# Patient Record
Sex: Male | Born: 1980 | Race: Black or African American | Hispanic: No | Marital: Single | Smoking: Former smoker
Health system: Southern US, Community
[De-identification: ages and names within clinical notes are randomized; demographics above are authoritative.]

## PROBLEM LIST (undated history)

## (undated) DIAGNOSIS — G8929 Other chronic pain: Secondary | ICD-10-CM

## (undated) DIAGNOSIS — M549 Dorsalgia, unspecified: Secondary | ICD-10-CM

## (undated) DIAGNOSIS — F32A Depression, unspecified: Secondary | ICD-10-CM

## (undated) DIAGNOSIS — K649 Unspecified hemorrhoids: Secondary | ICD-10-CM

## (undated) DIAGNOSIS — F329 Major depressive disorder, single episode, unspecified: Secondary | ICD-10-CM

## (undated) DIAGNOSIS — W3400XA Accidental discharge from unspecified firearms or gun, initial encounter: Secondary | ICD-10-CM

## (undated) DIAGNOSIS — E1140 Type 2 diabetes mellitus with diabetic neuropathy, unspecified: Secondary | ICD-10-CM

## (undated) DIAGNOSIS — E1169 Type 2 diabetes mellitus with other specified complication: Secondary | ICD-10-CM

## (undated) DIAGNOSIS — S02609A Fracture of mandible, unspecified, initial encounter for closed fracture: Secondary | ICD-10-CM

## (undated) DIAGNOSIS — E119 Type 2 diabetes mellitus without complications: Secondary | ICD-10-CM

## (undated) DIAGNOSIS — I469 Cardiac arrest, cause unspecified: Secondary | ICD-10-CM

## (undated) HISTORY — DX: Type 2 diabetes mellitus with other specified complication: E11.69

## (undated) HISTORY — DX: Cardiac arrest, cause unspecified: I46.9

## (undated) HISTORY — PX: APPENDECTOMY: SHX54

## (undated) HISTORY — DX: Dorsalgia, unspecified: M54.9

## (undated) HISTORY — DX: Fracture of mandible, unspecified, initial encounter for closed fracture: S02.609A

## (undated) HISTORY — DX: Depression, unspecified: F32.A

## (undated) HISTORY — DX: Unspecified hemorrhoids: K64.9

## (undated) HISTORY — PX: ABDOMINAL SURGERY: SHX537

## (undated) HISTORY — DX: Major depressive disorder, single episode, unspecified: F32.9

## (undated) HISTORY — DX: Other chronic pain: G89.29

## (undated) HISTORY — DX: Type 2 diabetes mellitus with diabetic neuropathy, unspecified: E11.40

## (undated) HISTORY — PX: OTHER SURGICAL HISTORY: SHX169

## (undated) HISTORY — PX: SPLENECTOMY, TOTAL: SHX788

---
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
## 1998-06-02 ENCOUNTER — Inpatient Hospital Stay (HOSPITAL_COMMUNITY): Admission: EM | Admit: 1998-06-02 | Discharge: 1998-06-09 | Payer: Self-pay | Admitting: Emergency Medicine

## 1998-06-02 ENCOUNTER — Encounter: Payer: Self-pay | Admitting: Emergency Medicine

## 2003-06-22 ENCOUNTER — Emergency Department (HOSPITAL_COMMUNITY): Admission: EM | Admit: 2003-06-22 | Discharge: 2003-06-22 | Payer: Self-pay | Admitting: Family Medicine

## 2003-09-27 ENCOUNTER — Emergency Department (HOSPITAL_COMMUNITY): Admission: EM | Admit: 2003-09-27 | Discharge: 2003-09-27 | Payer: Self-pay | Admitting: Family Medicine

## 2003-09-28 ENCOUNTER — Emergency Department (HOSPITAL_COMMUNITY): Admission: EM | Admit: 2003-09-28 | Discharge: 2003-09-28 | Payer: Self-pay | Admitting: Family Medicine

## 2003-09-29 ENCOUNTER — Emergency Department (HOSPITAL_COMMUNITY): Admission: EM | Admit: 2003-09-29 | Discharge: 2003-09-29 | Payer: Self-pay | Admitting: Family Medicine

## 2003-10-04 ENCOUNTER — Emergency Department (HOSPITAL_COMMUNITY): Admission: EM | Admit: 2003-10-04 | Discharge: 2003-10-04 | Payer: Self-pay | Admitting: Emergency Medicine

## 2003-10-11 ENCOUNTER — Emergency Department (HOSPITAL_COMMUNITY): Admission: EM | Admit: 2003-10-11 | Discharge: 2003-10-11 | Payer: Self-pay | Admitting: Emergency Medicine

## 2004-02-21 ENCOUNTER — Observation Stay (HOSPITAL_COMMUNITY): Admission: EM | Admit: 2004-02-21 | Discharge: 2004-02-22 | Payer: Self-pay | Admitting: Emergency Medicine

## 2004-02-21 ENCOUNTER — Ambulatory Visit: Payer: Self-pay | Admitting: Family Medicine

## 2004-02-23 ENCOUNTER — Emergency Department (HOSPITAL_COMMUNITY): Admission: EM | Admit: 2004-02-23 | Discharge: 2004-02-23 | Payer: Self-pay | Admitting: Emergency Medicine

## 2004-03-18 ENCOUNTER — Emergency Department (HOSPITAL_COMMUNITY): Admission: EM | Admit: 2004-03-18 | Discharge: 2004-03-19 | Payer: Self-pay | Admitting: Emergency Medicine
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
## 2006-12-02 ENCOUNTER — Observation Stay (HOSPITAL_COMMUNITY): Admission: EM | Admit: 2006-12-02 | Discharge: 2006-12-03 | Payer: Self-pay | Admitting: Emergency Medicine
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
## 2010-12-28 LAB — COMPREHENSIVE METABOLIC PANEL
Albumin: 3.9
BUN: 17
CO2: 32
Chloride: 96
Creatinine, Ser: 1.15
GFR calc non Af Amer: >60
Glucose, Bld: 115 — ABNORMAL HIGH
Total Bilirubin: 3 — ABNORMAL HIGH

## 2010-12-28 LAB — BASIC METABOLIC PANEL
Calcium: 9
Creatinine, Ser: 1.23
GFR calc Af Amer: >60
GFR calc non Af Amer: >60

## 2010-12-28 LAB — GLUCOSE, RANDOM: Glucose, Bld: 462 — ABNORMAL HIGH

## 2010-12-28 LAB — HEMOGLOBIN A1C
Hgb A1c MFr Bld: 9.6 — ABNORMAL HIGH
Mean Plasma Glucose: 264

## 2010-12-29 LAB — I-STAT 8, (EC8 V) (CONVERTED LAB)
Acid-Base Excess: 6 — ABNORMAL HIGH
Chloride: 97
Glucose, Bld: 162 — ABNORMAL HIGH
TCO2: 35
pCO2, Ven: 53.3 — ABNORMAL HIGH
pH, Ven: 7.402 — ABNORMAL HIGH

## 2010-12-29 LAB — RAPID URINE DRUG SCREEN, HOSP PERFORMED
Amphetamines: NOT DETECTED
Barbiturates: NOT DETECTED
Cocaine: NOT DETECTED
Opiates: NOT DETECTED
Tetrahydrocannabinol: NOT DETECTED

## 2010-12-29 LAB — URINALYSIS, ROUTINE W REFLEX MICROSCOPIC
Bilirubin Urine: NEGATIVE
Ketones, ur: 15 — AB
Leukocytes, UA: NEGATIVE
Nitrite: NEGATIVE
Protein, ur: 30 — AB

## 2012-01-09 ENCOUNTER — Emergency Department (HOSPITAL_COMMUNITY)
Admission: EM | Admit: 2012-01-09 | Discharge: 2012-01-09 | Disposition: A | Discharge status: Home / Self Care | Attending: Emergency Medicine | Admitting: Emergency Medicine

## 2012-01-09 ENCOUNTER — Encounter (HOSPITAL_COMMUNITY): Payer: Self-pay | Admitting: Emergency Medicine

## 2012-01-09 DIAGNOSIS — Z87891 Personal history of nicotine dependence: Secondary | ICD-10-CM | POA: Insufficient documentation

## 2012-01-09 DIAGNOSIS — E162 Hypoglycemia, unspecified: Secondary | ICD-10-CM

## 2012-01-09 DIAGNOSIS — Z794 Long term (current) use of insulin: Secondary | ICD-10-CM | POA: Insufficient documentation

## 2012-01-09 DIAGNOSIS — E1169 Type 2 diabetes mellitus with other specified complication: Secondary | ICD-10-CM | POA: Insufficient documentation

## 2012-01-09 HISTORY — DX: Type 2 diabetes mellitus without complications: E11.9

## 2012-01-09 LAB — GLUCOSE, CAPILLARY
Glucose-Capillary: 93 mg/dL (ref 70–99)
Glucose-Capillary: 177 mg/dL — ABNORMAL HIGH (ref 70–99)

## 2012-01-09 NOTE — ED Notes (Signed)
Awaiting transportation.
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
## 2012-01-09 NOTE — ED Provider Notes (Signed)
History     CSN: [id number]  Arrival date & time 01/09/12  1258   First MD Initiated Contact with Patient 01/09/12 1309      Chief Complaint  Patient presents with  . Hypoglycemia    (Consider location/radiation/quality/duration/timing/severity/associated sxs/prior treatment) The history is provided by the patient.   the patient presents from incarceration facility.  He's been incarcerated for approximately 9 years.  He is a diabetic.  Takes 25 units of Lantus at night and 20 units of 7030 insulin in the morning.  He takes 4 units of regular insulin as a sliding scale for 200-300.  He reports that he has a normal breakfast this morning but did not have his typical after breakfast/morning snack.  He was found to be hypoglycemic at around 11:30 AM.  He had breakfast around 6 AM.  He was given D50 and insulin glucose with improvement in his blood sugars.  He presents the emergency department where he was given orange juice and a meal tray.  His blood sugar on arrival to the emergency department was 93.  The patient reports a lifting this change was that he did have his typical morning snack.  He's otherwise been eating normally.  He denies nausea and vomiting.  No chest pain or abdominal pain.  He denies fevers or chills.  At this time is without any symptoms.  Past Medical History  Diagnosis Date  . Diabetes mellitus without complication     Past Surgical History  Procedure Date  . Abdominal surgery   . Spleenectomy   . Appendectomy     History reviewed. No pertinent family history.  History  Substance Use Topics  . Smoking status: Former Games developer  . Smokeless tobacco: Not on file  . Alcohol Use: No      Review of Systems  All other systems reviewed and are negative.    Allergies  Aspirin  Home Medications   Current Outpatient Rx  Name Route Sig Dispense Refill  . INSULIN GLARGINE 100 UNIT/ML [place] SOLN Subcutaneous Inject 25 Units into the skin at bedtime.    .  INSULIN ISOPHANE & REGULAR (70-30) 100 UNIT/ML [place] SUSP Subcutaneous Inject 20 Units into the skin daily.    . INSULIN REGULAR HUMAN 100 UNIT/ML IJ SOLN Subcutaneous Inject 4-14 Units into the skin 2 (two) times daily. Uses according to a sliding scale is sugar is over 200.      BP 177/98  Pulse 63  Temp 97.5 F (36.4 C) (Oral)  Resp 17  SpO2 98%  Physical Exam  Nursing note and vitals reviewed. Constitutional: He is oriented to person, place, and time. He appears well-developed and well-nourished.  HENT:  Head: Normocephalic and atraumatic.  Eyes: EOM are normal.  Neck: Normal range of motion.  Cardiovascular: Normal rate, regular rhythm, normal heart sounds and intact distal pulses.   Pulmonary/Chest: Effort normal and breath sounds normal. No respiratory distress.  Abdominal: Soft. He exhibits no distension. There is no tenderness.  Musculoskeletal: Normal range of motion.  Neurological: He is alert and oriented to person, place, and time.  Skin: Skin is warm and dry.  Psychiatric: He has a normal mood and affect. Judgment normal.    ED Course  Procedures (including critical care time)   Labs Reviewed  GLUCOSE, CAPILLARY  BASIC METABOLIC PANEL  CBC   No results found.   1. Hypoglycemia       MDM  Hypoglycemia secondary to change in diet habits as he did not  have his midmorning snack.  I suspect this is why he had a low at 11:30.  I recommended that he stay with a continuous feeding regimen/plan.  He understands importance of this.  The patient we discharged back to the incarceration facility        Lyanne Co, MD 01/09/12 1345

## 2013-03-19 HISTORY — PX: ORIF MANDIBULAR FRACTURE: SHX2127

## 2013-07-27 DIAGNOSIS — E119 Type 2 diabetes mellitus without complications: Secondary | ICD-10-CM | POA: Insufficient documentation

## 2015-02-03 ENCOUNTER — Emergency Department (HOSPITAL_COMMUNITY)
Admission: EM | Admit: 2015-02-03 | Discharge: 2015-02-03 | Disposition: A | Discharge status: Home / Self Care | Attending: Emergency Medicine | Admitting: Emergency Medicine

## 2015-02-03 ENCOUNTER — Encounter (HOSPITAL_COMMUNITY): Payer: Self-pay

## 2015-02-03 DIAGNOSIS — L02211 Cutaneous abscess of abdominal wall: Secondary | ICD-10-CM | POA: Insufficient documentation

## 2015-02-03 DIAGNOSIS — E1165 Type 2 diabetes mellitus with hyperglycemia: Secondary | ICD-10-CM | POA: Insufficient documentation

## 2015-02-03 DIAGNOSIS — Z760 Encounter for issue of repeat prescription: Secondary | ICD-10-CM

## 2015-02-03 DIAGNOSIS — Z87891 Personal history of nicotine dependence: Secondary | ICD-10-CM | POA: Insufficient documentation

## 2015-02-03 DIAGNOSIS — L0291 Cutaneous abscess, unspecified: Secondary | ICD-10-CM

## 2015-02-03 DIAGNOSIS — Z794 Long term (current) use of insulin: Secondary | ICD-10-CM | POA: Insufficient documentation

## 2015-02-03 DIAGNOSIS — Z9889 Other specified postprocedural states: Secondary | ICD-10-CM | POA: Insufficient documentation

## 2015-02-03 DIAGNOSIS — E119 Type 2 diabetes mellitus without complications: Secondary | ICD-10-CM | POA: Insufficient documentation

## 2015-02-03 DIAGNOSIS — Z79899 Other long term (current) drug therapy: Secondary | ICD-10-CM | POA: Insufficient documentation

## 2015-02-03 DIAGNOSIS — R739 Hyperglycemia, unspecified: Secondary | ICD-10-CM

## 2015-02-03 LAB — CBG MONITORING, ED
Glucose-Capillary: 210 mg/dL — ABNORMAL HIGH (ref 65–99)
Glucose-Capillary: 183 mg/dL — ABNORMAL HIGH (ref 65–99)

## 2015-02-03 MED ORDER — INSULIN GLARGINE 100 UNIT/ML ~~LOC~~ SOLN: SUBCUTANEOUS | Status: DC

## 2015-02-03 MED ORDER — INSULIN REGULAR HUMAN 100 UNIT/ML IJ SOLN: INTRAMUSCULAR | Status: DC

## 2015-02-03 MED ORDER — INSULIN PEN NEEDLE 31G X 8 MM MISC
Status: DC
Start: 2015-02-03 — End: 2015-04-25

## 2015-02-03 MED ORDER — LISINOPRIL 5 MG PO TABS: 20.0000 mg | ORAL_TABLET | Freq: Every day | ORAL | Status: DC

## 2015-02-03 MED ORDER — INSULIN GLARGINE 100 UNIT/ML ~~LOC~~ SOLN
25.0000 [IU] | Freq: Once | SUBCUTANEOUS | Status: AC
Start: 2015-02-03 — End: 2015-02-03
  Administered 2015-02-03: 25 [IU] via SUBCUTANEOUS
  Filled 2015-02-03: qty 0.25

## 2015-02-03 MED ORDER — MUPIROCIN 2 % EX OINT: TOPICAL_OINTMENT | CUTANEOUS | Status: DC

## 2015-02-03 MED ORDER — METFORMIN HCL 500 MG PO TABS: 500.0000 mg | ORAL_TABLET | Freq: Two times a day (BID) | ORAL | Status: DC

## 2015-02-03 NOTE — ED Notes (Signed)
Pharmacy was close to fill patients insulin, patient came back to get a shot of insulin to last until he can get his prescription filled tomorrow.

## 2015-02-03 NOTE — ED Notes (Signed)
cbg 183 in triage

## 2015-02-03 NOTE — ED Notes (Signed)
Patient states he just got out of prison today, and "needs insulin and needles" patient also states he has an abscess to the right hip.

## 2015-02-03 NOTE — Discharge Instructions (Signed)
Abscess An abscess (boil or furuncle) is an infected area on or under the skin. This area is filled with yellowish-white fluid (pus) and other material (debris). HOME CARE   Only take medicines as told by your doctor.  If you were given antibiotic medicine, take it as directed. Finish the medicine even if you start to feel better.  If gauze is used, follow your doctor's directions for changing the gauze.  To avoid spreading the infection:  Keep your abscess covered with a bandage.  Wash your hands well.  Do not share personal care items, towels, or whirlpools with others.  Avoid skin contact with others.  Keep your skin and clothes clean around the abscess.  Keep all doctor visits as told. GET HELP RIGHT AWAY IF:   You have more pain, puffiness (swelling), or redness in the wound site.  You have more fluid or blood coming from the wound site.  You have muscle aches, chills, or you feel sick.  You have a fever. MAKE SURE YOU:   Understand these instructions.  Will watch your condition.  Will get help right away if you are not doing well or get worse.   This information is not intended to replace advice given to you by your health care provider. Make sure you discuss any questions you have with your health care provider.   Document Released: 08/22/2007 Document Revised: 09/04/2011 Document Reviewed: 05/19/2011 Elsevier Interactive Patient Education [employer]  Medicine Refill at the Emergency Department We have refilled your medicine today, but it is best for you to get refills through your primary health care provider's office. In the future, please plan ahead so you do not need to get refills from the emergency department. If the medicine we refilled was a maintenance medicine, you may have received only enough to get you by until you are able to see your regular health care provider.   This information is not intended to replace advice given to you by your  health care provider. Make sure you discuss any questions you have with your health care provider.   Document Released: 06/22/2003 Document Revised: 03/26/2014 Document Reviewed: 06/12/2013 Elsevier Interactive Patient Education [employer] Elsevier Inc[employer]
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
## 2015-02-07 NOTE — ED Provider Notes (Signed)
CSN: [id number]     Arrival date & time 02/03/15  2118 History   First MD Initiated Contact with Patient 02/03/15 2144     Chief Complaint  Patient presents with  . Follow-up     (Consider location/radiation/quality/duration/timing/severity/associated sxs/prior Treatment) HPI   Jeremy Nguyen is a 34 y.o. male who presents to the Emergency Department shortly after being discharged from here after having his medications refilled.  He states that the pharmacy has already closed and he was unable to get his Lantus.  He states that he takes 25 units every evening.  He denies any symptoms at present.     Past Medical History  Diagnosis Date  . Diabetes mellitus without complication [facility](HCC)    Past Surgical History  Procedure Laterality Date  . Abdominal surgery    . Spleenectomy    . Appendectomy    . Splenectomy, total     History reviewed. No pertinent family history. Social History  Substance Use Topics  . Smoking status: Former Games developermoker  . Smokeless tobacco: None  . Alcohol Use: No    Review of Systems  Constitutional: Negative for fever, chills and appetite change.  HENT: Negative for congestion, sore throat and trouble swallowing.   Respiratory: Negative for shortness of breath.   Cardiovascular: Negative for chest pain.  Gastrointestinal: Negative for nausea, vomiting and diarrhea.  Endocrine: Negative for polydipsia and polyuria.  Neurological: Negative for dizziness, syncope and light-headedness.  All other systems reviewed and are negative.     Allergies  Aspirin and Ativan  Home Medications   Prior to Admission medications   Medication Sig Start Date End Date Taking? Authorizing Provider  insulin glargine (LANTUS) 100 UNIT/ML injection Inject 25 units into the skin at bedtime 02/03/15  Yes Tyrees Chopin, PA-C  insulin NPH-insulin regular (NOVOLIN 70/30) (70-30) 100 UNIT/ML injection Inject 20 Units into the skin daily.   Yes Historical Provider, MD   insulin regular (NOVOLIN R,HUMULIN R) 100 units/mL injection Inject 4-14 units into the skin 2 times a day.  Use according to a sliding scale if sugar is over 200 02/03/15  Yes Cresencio Reesor, PA-C  lisinopril (PRINIVIL,ZESTRIL) 5 MG tablet Take 4 tablets (20 mg total) by mouth daily. Patient taking differently: Take 5 mg by mouth daily.  02/03/15  Yes Verna Desrocher, PA-C  metFORMIN (GLUCOPHAGE) 500 MG tablet Take 1 tablet (500 mg total) by mouth 2 (two) times daily with a meal. 02/03/15  Yes Evalin Shawhan, PA-C  mupirocin ointment (BACTROBAN) 2 % Apply TID to the affected area x 10 days Patient taking differently: Apply 1 application topically 3 (three) times daily. Apply TID to the affected area x 10 days 02/03/15  Yes Vanisha Whiten, PA-C  Insulin Pen Needle 31G X 8 MM MISC Use as directed 02/03/15   Kynadi Dragos, PA-C   BP 137/73 mmHg  Pulse 89  Temp(Src) 98.3 F (36.8 C) (Oral)  Resp 14  SpO2 99% Physical Exam  Constitutional: He is oriented to person, place, and time. He appears well-developed and well-nourished. No distress.  HENT:  Head: Normocephalic and atraumatic.  Mouth/Throat: Oropharynx is clear and moist.  Neck: Normal range of motion. Neck supple.  Cardiovascular: Normal rate and regular rhythm.   Pulmonary/Chest: Effort normal and breath sounds normal. No respiratory distress.  Musculoskeletal: Normal range of motion.  Lymphadenopathy:    He has no cervical adenopathy.  Neurological: He is alert and oriented to person, place, and time. He exhibits normal muscle tone. Coordination  normal.  Skin: Skin is warm.  Psychiatric: He has a normal mood and affect.  Nursing note and vitals reviewed.   ED Course  Procedures (including critical care time) Labs Review Labs Reviewed  CBG MONITORING, ED - Abnormal; Notable for the following:    Glucose-Capillary 183 (*)    All other components within normal limits      MDM   Final diagnoses:  Hyperglycemia    Pt  just d/c from here and returned requesting his evening dose of Lantus because he was unable to get to the pharmacy before it closed.  Pt is well appearing, no sx's at present.      Pauline Aus, PA-C 02/07/15 2036  Bethann Berkshire, MD 02/09/15 [phone]

## 2015-04-25 ENCOUNTER — Encounter (HOSPITAL_COMMUNITY): Payer: Self-pay

## 2015-04-25 ENCOUNTER — Emergency Department (HOSPITAL_COMMUNITY)

## 2015-04-25 ENCOUNTER — Emergency Department (HOSPITAL_COMMUNITY)
Admission: EM | Admit: 2015-04-25 | Discharge: 2015-04-25 | Disposition: A | Discharge status: Home / Self Care | Attending: Emergency Medicine | Admitting: Emergency Medicine

## 2015-04-25 DIAGNOSIS — Z7984 Long term (current) use of oral hypoglycemic drugs: Secondary | ICD-10-CM | POA: Insufficient documentation

## 2015-04-25 DIAGNOSIS — Z794 Long term (current) use of insulin: Secondary | ICD-10-CM | POA: Insufficient documentation

## 2015-04-25 DIAGNOSIS — J069 Acute upper respiratory infection, unspecified: Secondary | ICD-10-CM

## 2015-04-25 DIAGNOSIS — Z79899 Other long term (current) drug therapy: Secondary | ICD-10-CM | POA: Insufficient documentation

## 2015-04-25 DIAGNOSIS — F17210 Nicotine dependence, cigarettes, uncomplicated: Secondary | ICD-10-CM | POA: Insufficient documentation

## 2015-04-25 DIAGNOSIS — E119 Type 2 diabetes mellitus without complications: Secondary | ICD-10-CM | POA: Insufficient documentation

## 2015-04-25 DIAGNOSIS — Z87828 Personal history of other (healed) physical injury and trauma: Secondary | ICD-10-CM | POA: Insufficient documentation

## 2015-04-25 HISTORY — DX: Accidental discharge from unspecified firearms or gun, initial encounter: W34.00XA

## 2015-04-25 MED ORDER — LORATADINE 10 MG PO TABS: 10.0000 mg | ORAL_TABLET | Freq: Every day | ORAL | Status: DC

## 2015-04-25 MED ORDER — ALBUTEROL SULFATE (2.5 MG/3ML) 0.083% IN NEBU: 5.0000 mg | INHALATION_SOLUTION | Freq: Once | RESPIRATORY_TRACT | Status: DC

## 2015-04-25 MED ORDER — ALBUTEROL SULFATE HFA 108 (90 BASE) MCG/ACT IN AERS
2.0000 | INHALATION_SPRAY | RESPIRATORY_TRACT | Status: DC | PRN
  Administered 2015-04-25: 2 via RESPIRATORY_TRACT
  Filled 2015-04-25: qty 6.7

## 2015-04-25 MED ORDER — ALBUTEROL SULFATE (2.5 MG/3ML) 0.083% IN NEBU
5.0000 mg | INHALATION_SOLUTION | Freq: Once | RESPIRATORY_TRACT | Status: AC
Start: 2015-04-25 — End: 2015-04-25
  Administered 2015-04-25: 5 mg via RESPIRATORY_TRACT
  Filled 2015-04-25: qty 6

## 2015-04-25 MED ORDER — BENZONATATE 100 MG PO CAPS: 100.0000 mg | ORAL_CAPSULE | Freq: Three times a day (TID) | ORAL | Status: DC

## 2015-04-25 NOTE — ED Notes (Signed)
Pt c/o productive cough, sob, and chest pain when coughing. Pt report these s/s began yesterday morning. Pt A+OX4, speaking in complete sentences.

## 2015-04-25 NOTE — Discharge Instructions (Signed)

## 2015-04-25 NOTE — ED Notes (Signed)
Patient transported to X-ray 
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
## 2015-10-21 ENCOUNTER — Encounter: Payer: Self-pay | Admitting: Adult Health

## 2015-10-21 ENCOUNTER — Ambulatory Visit (INDEPENDENT_AMBULATORY_CARE_PROVIDER_SITE_OTHER): Payer: Self-pay | Admitting: Adult Health

## 2015-10-21 VITALS — BP 132/98 | Temp 98.0°F | Ht 73.0 in | Wt 176.2 lb

## 2015-10-21 DIAGNOSIS — IMO0002 Reserved for concepts with insufficient information to code with codable children: Secondary | ICD-10-CM

## 2015-10-21 DIAGNOSIS — E1065 Type 1 diabetes mellitus with hyperglycemia: Secondary | ICD-10-CM

## 2015-10-21 DIAGNOSIS — E108 Type 1 diabetes mellitus with unspecified complications: Secondary | ICD-10-CM

## 2015-10-21 DIAGNOSIS — Z7189 Other specified counseling: Secondary | ICD-10-CM

## 2015-10-21 DIAGNOSIS — Z7689 Persons encountering health services in other specified circumstances: Secondary | ICD-10-CM

## 2015-10-21 LAB — BASIC METABOLIC PANEL
BUN: 13 mg/dL (ref 6–23)
CHLORIDE: 103 meq/L (ref 96–112)
CO2: 33 meq/L — AB (ref 19–32)
CREATININE: 1.25 mg/dL (ref 0.40–1.50)
Calcium: 9.8 mg/dL (ref 8.4–10.5)
GFR: 83.6 mL/min (ref >60.00)
GLUCOSE: 65 mg/dL — AB (ref 70–99)
POTASSIUM: 3.9 meq/L (ref 3.5–5.1)
Sodium: 141 mEq/L (ref 135–145)

## 2015-10-21 LAB — HEMOGLOBIN A1C: Hgb A1c MFr Bld: 6.7 % — ABNORMAL HIGH (ref 4.6–6.5)

## 2015-10-21 LAB — MAGNESIUM: MAGNESIUM: 1.8 mg/dL (ref 1.5–2.5)

## 2015-10-21 NOTE — Patient Instructions (Signed)
It was great meeting you today  I will follow up with you regarding your blood work   Let me get your files from the prison system and the [facility] doctor and then we will discuss the next steps
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
## 2015-10-25 ENCOUNTER — Encounter: Payer: Self-pay | Admitting: Adult Health

## 2015-10-25 ENCOUNTER — Telehealth: Payer: Self-pay | Admitting: Adult Health

## 2015-10-25 NOTE — Telephone Encounter (Signed)
Spoke to patients mother, Britta MccreedyBarbara and informed her of the labs. I am going to keep him at 20 units of Lantus and take Metformin as he currently is. He can use sliding scale as needed.   Work on decreasing carbs and sugars.   He has an appointment with endocrinology in September.

## 2015-10-27 ENCOUNTER — Encounter: Payer: Self-pay | Admitting: Adult Health

## 2015-11-08 ENCOUNTER — Ambulatory Visit: Admitting: Adult Health

## 2015-11-16 ENCOUNTER — Telehealth: Payer: Self-pay | Admitting: Adult Health

## 2015-11-24 ENCOUNTER — Encounter: Payer: Self-pay | Admitting: Adult Health

## 2015-11-30 ENCOUNTER — Encounter: Payer: Self-pay | Admitting: Endocrinology

## 2015-11-30 ENCOUNTER — Ambulatory Visit (INDEPENDENT_AMBULATORY_CARE_PROVIDER_SITE_OTHER): Payer: Self-pay | Admitting: Endocrinology

## 2015-11-30 VITALS — BP 108/58 | HR 84 | Ht 73.0 in | Wt 174.0 lb

## 2015-11-30 DIAGNOSIS — Z794 Long term (current) use of insulin: Secondary | ICD-10-CM

## 2015-11-30 DIAGNOSIS — E1165 Type 2 diabetes mellitus with hyperglycemia: Secondary | ICD-10-CM

## 2015-11-30 MED ORDER — INSULIN REGULAR HUMAN 100 UNIT/ML IJ SOLN: INTRAMUSCULAR | 0 refills | Status: DC

## 2015-11-30 MED ORDER — METFORMIN HCL ER 500 MG PO TB24: 2000.0000 mg | ORAL_TABLET | Freq: Every day | ORAL | 3 refills | Status: DC

## 2015-11-30 NOTE — Patient Instructions (Addendum)
Check blood sugars on waking up  3-4 x per week  Also check blood sugars about 2 hours after a meal and do this after different meals by rotation  Recommended blood sugar levels on waking up is 90-130 and about 2 hours after meal is 130-160  Please bring your blood sugar monitor to each visit, thank you  LANTUS insulin: Change this to the morning and start with 22 units.  REGULAR insulin: This needs to be taken before each meal to cover the food intake. Tried to take this 15 minutes before eating as it works in about 30 minutes  Take 3 units for small meals and 4-5 units for larger meals.  May take an additional 1-2 units more if blood sugars are over 200 before eating  Avoid fried food  METFORMIN: Start taking 1 tablet twice a day and after 1 week increase the dose to 2 tablets twice a day Call if having low blood sugars
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
## 2015-12-05 ENCOUNTER — Other Ambulatory Visit: Payer: Self-pay | Admitting: *Deleted

## 2015-12-05 ENCOUNTER — Telehealth: Payer: Self-pay | Admitting: Endocrinology

## 2015-12-05 MED ORDER — INSULIN ASPART 100 UNIT/ML ~~LOC~~ SOLN: 3.0000 [IU] | Freq: Three times a day (TID) | SUBCUTANEOUS | 3 refills | Status: DC

## 2015-12-05 NOTE — Telephone Encounter (Signed)
Patients mom said that her son can get the novolog from the health department, she requested an rx for this to take to them. Rx is on your desk for signature.

## 2015-12-05 NOTE — Telephone Encounter (Signed)
PT mother called she just requested to speak with Dr. Remus BlakeKumar's nurse. Request call back.

## 2015-12-07 ENCOUNTER — Encounter: Payer: Self-pay | Attending: Endocrinology | Admitting: Nutrition

## 2015-12-07 DIAGNOSIS — Z794 Long term (current) use of insulin: Secondary | ICD-10-CM | POA: Insufficient documentation

## 2015-12-07 DIAGNOSIS — Z713 Dietary counseling and surveillance: Secondary | ICD-10-CM | POA: Insufficient documentation

## 2015-12-07 DIAGNOSIS — E108 Type 1 diabetes mellitus with unspecified complications: Secondary | ICD-10-CM

## 2015-12-07 DIAGNOSIS — E1165 Type 2 diabetes mellitus with hyperglycemia: Secondary | ICD-10-CM | POA: Insufficient documentation

## 2015-12-07 NOTE — Patient Instructions (Signed)
Take R insulin 15-30 min before eating.   Take 3 units before smaller meals, and 4-5 units before larger meals  Take 22u of Lantus every morning.

## 2015-12-07 NOTE — Progress Notes (Signed)
Patient did not bring meter.  Says FBS today at 10AM was 246.  He has not gotten the R insulin as yet, and is taking 25u of Lantus q AM now. Strongly encouraged him to start on the R insulin.  We discussed how it works, why he needs it and how to adjust the dosage for larger and smaller meals. He is eating breakfast and lunches out most of the time, and drinking sweet drinks with 1/2 of his meals.  He was encouraged to take the 5units when doing this. Meals are ususally eaten at 10AM- sausage, egg, and cheese biscuit with sweet tea, Lunch is at [facility] and this is usually fast food--12 inch sub, fries, or chips and sweet drink. Supper is eaten at home and lower in carbs, but eats snacks after supper of chips, pretzels, or crackers.   Suggested he test his blood sugars before meals and at bedtime and call me Monday.  He agreed to do this.    Strongly encouraged him to stop the sweet drinks, but he did not agreed to do this at this time.   Has lost 35 pounds and wants to gain 20 back.  Told him that he will do this once he starts on the R insulin.  He agreed to get it on his way home, and start it tonight.

## 2015-12-14 ENCOUNTER — Encounter: Payer: Self-pay | Admitting: Nutrition

## 2015-12-14 DIAGNOSIS — Z794 Long term (current) use of insulin: Principal | ICD-10-CM

## 2015-12-14 DIAGNOSIS — E119 Type 2 diabetes mellitus without complications: Secondary | ICD-10-CM

## 2015-12-15 ENCOUNTER — Telehealth: Payer: Self-pay | Admitting: Endocrinology

## 2015-12-15 NOTE — Telephone Encounter (Signed)
I need to speak with someone named Marylu LundJanet, I have already faxed back the paperwork to them.

## 2015-12-15 NOTE — Progress Notes (Signed)
Mr Jeremy Nguyen reports drinking less regular teas/sodas and taking his R insulin and waiting 15-30 min. Before eating every meal.   Insulin dose:  Lantus 20u daily--reports having taken it every day since seeing me.  R:  2-5u ac meals. Meter download shows on low FBS of 50, other wise: 149-236, acL: One low: 42@11AM , then the rest: 99-260,  AcS:  one low 50, then all over 186-375,  (highs due to sweet drinks) AcS: (1 low; 49, then 129-302,  HS: 3 lows: 37, 49, 62 Pt is taking varing amounts of R before meals, and did not mark how much he took.   Discussed the need to reduce the R that he takes acS by 1u, and also discussed the need to take 1 extra unit when drinking sweet drinks.  He agreed to do this Since FBSs are still high, I suggested he increase the Lantus to 21u.  He agreed to do this. Written instructions were given for insulin dose changes. He had no questions

## 2015-12-15 NOTE — Telephone Encounter (Signed)
Pt's mom states the health dept is awaiting our response to the paperwork they faxed to us for the Novolog to be signed off.  Please let pt know what the status is for this paperwork, pt rx for Novolog will not be filled until we complete this paperwork.  The # for the health dept is [phone] Celine MansSonja is the coordinator or can speak with the pharmacy

## 2015-12-15 NOTE — Patient Instructions (Signed)
Increase lantus to 21u Decrease R  Before supper by1-2u,

## 2015-12-26 ENCOUNTER — Telehealth: Payer: Self-pay | Admitting: Endocrinology

## 2015-12-26 NOTE — Telephone Encounter (Signed)
The health dept states they have not received the paperwork for the pt's novolog, I informed the pt's mom that we have sent it to them already and she is calling them back.

## 2016-01-06 ENCOUNTER — Encounter: Payer: Self-pay | Admitting: Endocrinology

## 2016-01-06 ENCOUNTER — Ambulatory Visit (INDEPENDENT_AMBULATORY_CARE_PROVIDER_SITE_OTHER): Payer: Self-pay | Admitting: Endocrinology

## 2016-01-06 VITALS — BP 138/90 | HR 77 | Temp 98.2°F | Resp 16 | Ht 73.0 in | Wt 177.4 lb

## 2016-01-06 DIAGNOSIS — E1165 Type 2 diabetes mellitus with hyperglycemia: Secondary | ICD-10-CM

## 2016-01-06 DIAGNOSIS — Z23 Encounter for immunization: Secondary | ICD-10-CM

## 2016-01-06 DIAGNOSIS — Z794 Long term (current) use of insulin: Secondary | ICD-10-CM

## 2016-01-06 NOTE — Patient Instructions (Addendum)
Lantus 20 units in am and 6 units at bedtime  Novolog 5 units before Bfst and lunch and 7 at supper   Add 2 more units for larger meals  Extra Novolog for high sugars: 200-250, take 2 units; 250 or more take 4 unirs  No Novolog at bedtime
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
## 2016-01-08 DIAGNOSIS — E1165 Type 2 diabetes mellitus with hyperglycemia: Secondary | ICD-10-CM | POA: Insufficient documentation

## 2016-01-08 DIAGNOSIS — Z794 Long term (current) use of insulin: Principal | ICD-10-CM

## 2016-01-23 ENCOUNTER — Telehealth: Payer: Self-pay | Admitting: Endocrinology

## 2016-01-23 NOTE — Telephone Encounter (Signed)
Patient called and he had taken 22 units of Novolog insulin of 22 of Lantus.  He was feeling fine, his blood sugar this morning was 252.  He was eating breakfast and asked him to drink extra juice Reminded him to start checking blood sugar every hour for the next 3-4 hours and if getting below 100 during 8 ounces of apple juice or regular soft drink

## 2016-01-24 ENCOUNTER — Encounter: Payer: Self-pay | Admitting: Nutrition

## 2016-02-03 ENCOUNTER — Other Ambulatory Visit (INDEPENDENT_AMBULATORY_CARE_PROVIDER_SITE_OTHER): Payer: Self-pay

## 2016-02-03 DIAGNOSIS — Z794 Long term (current) use of insulin: Secondary | ICD-10-CM

## 2016-02-03 DIAGNOSIS — E1165 Type 2 diabetes mellitus with hyperglycemia: Secondary | ICD-10-CM

## 2016-02-03 LAB — HEMOGLOBIN A1C: HEMOGLOBIN A1C: 7 % — AB (ref 4.6–6.5)

## 2016-02-03 LAB — BASIC METABOLIC PANEL
BUN: 21 mg/dL (ref 6–23)
CO2: 31 mEq/L (ref 19–32)
Calcium: 9.3 mg/dL (ref 8.4–10.5)
Chloride: 101 mEq/L (ref 96–112)
Creatinine, Ser: 1.39 mg/dL (ref 0.40–1.50)
GFR: 73.84 mL/min (ref >60.00)
Glucose, Bld: 130 mg/dL — ABNORMAL HIGH (ref 70–99)
POTASSIUM: 3.9 meq/L (ref 3.5–5.1)
SODIUM: 139 meq/L (ref 135–145)

## 2016-02-03 LAB — MICROALBUMIN / CREATININE URINE RATIO
CREATININE, U: 285.4 mg/dL
MICROALB UR: 13.8 mg/dL — AB (ref 0.0–1.9)
Microalb Creat Ratio: 4.8 mg/g (ref 0.0–30.0)

## 2016-02-03 LAB — LDL CHOLESTEROL, DIRECT: Direct LDL: 66 mg/dL

## 2016-02-06 ENCOUNTER — Ambulatory Visit: Payer: Self-pay | Admitting: Endocrinology

## 2016-02-07 ENCOUNTER — Encounter: Payer: Self-pay | Admitting: Nutrition

## 2016-02-15 ENCOUNTER — Other Ambulatory Visit: Payer: Self-pay

## 2016-02-15 ENCOUNTER — Encounter: Payer: Self-pay | Attending: Endocrinology | Admitting: Nutrition

## 2016-02-15 DIAGNOSIS — Z713 Dietary counseling and surveillance: Secondary | ICD-10-CM | POA: Insufficient documentation

## 2016-02-15 DIAGNOSIS — E1165 Type 2 diabetes mellitus with hyperglycemia: Secondary | ICD-10-CM | POA: Insufficient documentation

## 2016-02-15 DIAGNOSIS — Z794 Long term (current) use of insulin: Secondary | ICD-10-CM | POA: Insufficient documentation

## 2016-02-15 MED ORDER — GLUCOSE BLOOD VI STRP: ORAL_STRIP | 3 refills | Status: DC

## 2016-02-16 ENCOUNTER — Encounter: Payer: Self-pay | Admitting: Endocrinology

## 2016-02-16 NOTE — Progress Notes (Signed)
Pt. Did not bring his meter-saying that I was able to download it from the internet.  I explained that we could not do this, and that it could only be downloaded here on our computers. He says that he can not afford the test strips for his Accu-Check [employer].  He is paying $100.00 for 100 test strips.   He was given a Accu-Chek Guide meter with the copay card to bring his cost down to $39.99 for 100 strips.  He was very pleased with this, and promised to get the test strips. He is working out each day now for 1-1/2 hours, wanting to gain weight.  He reports that his blood sugars are "much better" since he has started doing this.  Said FBS today was 97.  I tested in the office and it was 147, 2hr. After lunch.  He worked out this morning.   Bfast is a breakfast plate from [employer] of eggs, [place] sausuage, grits and coffee.  Lunch is a sandwich, and he reports drinking less sweet drinks now, drinking mostly water.  He will occasionally have a sweet drink, but dilutes it now with 1/2 water.  I praised him for this,  and he seemed very pleased with himself.  Supper meal continues to be variable in carbs, but ususally limited to 60-90 grams.  Denies HS snacking Questions were answered about taking protein shakes to help gain weight.  Suggested one scoup of protein powder to water once a day--usually around lunch time (14-20 grams of protein).  He wants to drink Glucerna, but told him that this was 300 calories, and that it might run his blood sugar up.  He suggested he take it before exercise.  I told him to test his blood sugar 2 hours after exercise, and if the reading was less than 130, it was ok to drink.  He reported good understanding of this. He had no final questions.

## 2016-02-16 NOTE — Progress Notes (Signed)
This encounter was created in error - please disregard.

## 2016-02-16 NOTE — Patient Instructions (Signed)
Reduce the amount of sweet drinks to once a month. Start using Accu-Chek Guide meter, and bring it to each visit Call if questions.

## 2016-02-20 ENCOUNTER — Ambulatory Visit: Payer: Self-pay | Admitting: Endocrinology

## 2016-02-21 ENCOUNTER — Encounter: Payer: Self-pay | Admitting: Endocrinology

## 2016-02-21 ENCOUNTER — Ambulatory Visit (INDEPENDENT_AMBULATORY_CARE_PROVIDER_SITE_OTHER): Payer: Self-pay | Admitting: Endocrinology

## 2016-02-21 ENCOUNTER — Other Ambulatory Visit: Payer: Self-pay

## 2016-02-21 VITALS — BP 122/70 | HR 83 | Ht 73.0 in | Wt 188.0 lb

## 2016-02-21 DIAGNOSIS — Z794 Long term (current) use of insulin: Secondary | ICD-10-CM

## 2016-02-21 DIAGNOSIS — E1165 Type 2 diabetes mellitus with hyperglycemia: Secondary | ICD-10-CM

## 2016-02-21 MED ORDER — GLUCOSE BLOOD VI STRP: ORAL_STRIP | 3 refills | Status: DC
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
## 2016-02-21 NOTE — Patient Instructions (Addendum)
Lantus 10 units daily  Novolog 3 units, skip when exercising

## 2016-03-27 NOTE — Telephone Encounter (Signed)
Error

## 2016-04-22 ENCOUNTER — Encounter (HOSPITAL_COMMUNITY): Payer: Self-pay | Admitting: Emergency Medicine

## 2016-04-22 ENCOUNTER — Emergency Department (HOSPITAL_COMMUNITY): Admission: EM | Admit: 2016-04-22 | Discharge: 2016-04-22 | Discharge status: Against Medical Advice | Payer: Self-pay

## 2016-04-22 ENCOUNTER — Ambulatory Visit (HOSPITAL_COMMUNITY)
Admission: EM | Admit: 2016-04-22 | Discharge: 2016-04-22 | Disposition: A | Discharge status: Home / Self Care | Payer: Self-pay | Attending: Family Medicine | Admitting: Family Medicine

## 2016-04-22 DIAGNOSIS — Z794 Long term (current) use of insulin: Secondary | ICD-10-CM | POA: Insufficient documentation

## 2016-04-22 DIAGNOSIS — F17210 Nicotine dependence, cigarettes, uncomplicated: Secondary | ICD-10-CM | POA: Insufficient documentation

## 2016-04-22 DIAGNOSIS — M549 Dorsalgia, unspecified: Secondary | ICD-10-CM | POA: Insufficient documentation

## 2016-04-22 DIAGNOSIS — F329 Major depressive disorder, single episode, unspecified: Secondary | ICD-10-CM | POA: Insufficient documentation

## 2016-04-22 DIAGNOSIS — N3091 Cystitis, unspecified with hematuria: Secondary | ICD-10-CM | POA: Insufficient documentation

## 2016-04-22 DIAGNOSIS — E1140 Type 2 diabetes mellitus with diabetic neuropathy, unspecified: Secondary | ICD-10-CM | POA: Insufficient documentation

## 2016-04-22 DIAGNOSIS — G8929 Other chronic pain: Secondary | ICD-10-CM | POA: Insufficient documentation

## 2016-04-22 LAB — POCT URINALYSIS DIP (DEVICE)
BILIRUBIN URINE: NEGATIVE
Nitrite: POSITIVE — AB
Urobilinogen, UA: 1 mg/dL (ref 0.0–1.0)
pH: 6 (ref 5.0–8.0)

## 2016-04-22 LAB — GLUCOSE, CAPILLARY: GLUCOSE-CAPILLARY: 310 mg/dL — AB (ref 65–99)

## 2016-04-22 MED ORDER — CIPROFLOXACIN HCL 500 MG PO TABS: 500.0000 mg | ORAL_TABLET | Freq: Two times a day (BID) | ORAL | 0 refills | Status: DC

## 2016-04-22 NOTE — Discharge Instructions (Signed)
Please get your urine rechecked in 2 weeks. Continue to monitor your sugar. Drink plenty of fluids.

## 2016-04-22 NOTE — ED Triage Notes (Signed)
[facility] a [facility] side lower back pain and hematuria x 2 days.

## 2016-04-22 NOTE — ED Notes (Signed)
Dirty and clean  Urine collected.
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
## 2016-04-22 NOTE — ED Notes (Signed)
Pt states he is inconsistent with his insulin doses; states he took his lantus this AM with a home CBG = 189 this AM.

## 2016-04-24 LAB — URINE CULTURE: Culture: >=100000 — AB

## 2016-05-18 ENCOUNTER — Other Ambulatory Visit (INDEPENDENT_AMBULATORY_CARE_PROVIDER_SITE_OTHER): Payer: Self-pay

## 2016-05-18 DIAGNOSIS — E1165 Type 2 diabetes mellitus with hyperglycemia: Secondary | ICD-10-CM

## 2016-05-18 DIAGNOSIS — Z794 Long term (current) use of insulin: Secondary | ICD-10-CM

## 2016-05-18 LAB — HEMOGLOBIN A1C: Hgb A1c MFr Bld: 7.5 % — ABNORMAL HIGH (ref 4.6–6.5)

## 2016-05-21 ENCOUNTER — Ambulatory Visit: Payer: Self-pay | Admitting: Endocrinology

## 2016-05-25 ENCOUNTER — Ambulatory Visit (INDEPENDENT_AMBULATORY_CARE_PROVIDER_SITE_OTHER): Payer: Self-pay | Admitting: Endocrinology

## 2016-05-25 ENCOUNTER — Encounter: Payer: Self-pay | Admitting: Endocrinology

## 2016-05-25 VITALS — BP 118/80 | HR 88 | Ht 73.0 in | Wt 177.0 lb

## 2016-05-25 DIAGNOSIS — E1065 Type 1 diabetes mellitus with hyperglycemia: Secondary | ICD-10-CM

## 2016-05-25 NOTE — Patient Instructions (Addendum)
Lantus 15 in am and 10 in pm consistently and only change if you are waking up with hi or lo sigars  Must do 3-5 units Novolog with EVERY meal unless just eating a salad with no carbs, try to keep sugar under 190 2 hrs after the meal  Get the Relion meter with a download port
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
## 2016-06-26 ENCOUNTER — Encounter (HOSPITAL_COMMUNITY): Payer: Self-pay | Admitting: *Deleted

## 2016-06-26 ENCOUNTER — Emergency Department (HOSPITAL_COMMUNITY)
Admission: EM | Admit: 2016-06-26 | Discharge: 2016-06-27 | Disposition: A | Discharge status: Home / Self Care | Payer: No Typology Code available for payment source | Attending: Emergency Medicine | Admitting: Emergency Medicine

## 2016-06-26 ENCOUNTER — Emergency Department (HOSPITAL_COMMUNITY): Payer: No Typology Code available for payment source

## 2016-06-26 DIAGNOSIS — Y939 Activity, unspecified: Secondary | ICD-10-CM | POA: Diagnosis not present

## 2016-06-26 DIAGNOSIS — Y999 Unspecified external cause status: Secondary | ICD-10-CM | POA: Insufficient documentation

## 2016-06-26 DIAGNOSIS — E1140 Type 2 diabetes mellitus with diabetic neuropathy, unspecified: Secondary | ICD-10-CM | POA: Insufficient documentation

## 2016-06-26 DIAGNOSIS — Z794 Long term (current) use of insulin: Secondary | ICD-10-CM | POA: Insufficient documentation

## 2016-06-26 DIAGNOSIS — F17210 Nicotine dependence, cigarettes, uncomplicated: Secondary | ICD-10-CM | POA: Insufficient documentation

## 2016-06-26 DIAGNOSIS — S0990XA Unspecified injury of head, initial encounter: Secondary | ICD-10-CM | POA: Diagnosis present

## 2016-06-26 DIAGNOSIS — Y92410 Unspecified street and highway as the place of occurrence of the external cause: Secondary | ICD-10-CM | POA: Insufficient documentation

## 2016-06-26 DIAGNOSIS — S39012A Strain of muscle, fascia and tendon of lower back, initial encounter: Secondary | ICD-10-CM | POA: Insufficient documentation

## 2016-06-26 DIAGNOSIS — S060X0A Concussion without loss of consciousness, initial encounter: Secondary | ICD-10-CM | POA: Diagnosis not present

## 2016-06-26 LAB — CBC
HCT: 41.8 % (ref 39.0–52.0)
Hemoglobin: 15.2 g/dL (ref 13.0–17.0)
MCH: 32.7 pg (ref 26.0–34.0)
MCHC: 36.4 g/dL — ABNORMAL HIGH (ref 30.0–36.0)
MCV: 89.9 fL (ref 78.0–100.0)
PLATELETS: 245 10*3/uL (ref 150–400)
RBC: 4.65 MIL/uL (ref 4.22–5.81)
RDW: 14.1 % (ref 11.5–15.5)
WBC: 6.9 10*3/uL (ref 4.0–10.5)

## 2016-06-26 LAB — I-STAT TROPONIN, ED: Troponin i, poc: 0 ng/mL (ref 0.00–0.08)

## 2016-06-26 LAB — BASIC METABOLIC PANEL
Anion gap: 14 (ref 5–15)
BUN: 24 mg/dL — ABNORMAL HIGH (ref 6–20)
CHLORIDE: 100 mmol/L — AB (ref 101–111)
CO2: 24 mmol/L (ref 22–32)
CREATININE: 1.5 mg/dL — AB (ref 0.61–1.24)
Calcium: 9.5 mg/dL (ref 8.9–10.3)
GFR calc non Af Amer: 58 mL/min — ABNORMAL LOW (ref >60)
Glucose, Bld: 99 mg/dL (ref 65–99)
Potassium: 3.8 mmol/L (ref 3.5–5.1)
Sodium: 138 mmol/L (ref 135–145)

## 2016-06-26 MED ORDER — HYDROCODONE-ACETAMINOPHEN 5-325 MG PO TABS
1.0000 | ORAL_TABLET | Freq: Once | ORAL | Status: AC
  Administered 2016-06-26: 1 via ORAL
  Filled 2016-06-26: qty 1

## 2016-06-26 MED ORDER — LIDOCAINE 5 % EX OINT: 1.0000 "application " | TOPICAL_OINTMENT | CUTANEOUS | 0 refills | Status: DC | PRN

## 2016-06-26 MED ORDER — CYCLOBENZAPRINE HCL 10 MG PO TABS: 10.0000 mg | ORAL_TABLET | Freq: Two times a day (BID) | ORAL | 0 refills | Status: DC | PRN

## 2016-06-26 MED ORDER — DICLOFENAC SODIUM 1 % TD GEL: 2.0000 g | Freq: Four times a day (QID) | TRANSDERMAL | 1 refills | Status: DC

## 2016-06-26 MED ORDER — CYCLOBENZAPRINE HCL 10 MG PO TABS
10.0000 mg | ORAL_TABLET | Freq: Once | ORAL | Status: AC
  Administered 2016-06-26: 10 mg via ORAL
  Filled 2016-06-26: qty 1

## 2016-06-26 MED ORDER — HYDROCODONE-ACETAMINOPHEN 5-325 MG PO TABS: 1.0000 | ORAL_TABLET | Freq: Four times a day (QID) | ORAL | 0 refills | Status: DC | PRN
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
## 2016-06-26 NOTE — ED Notes (Signed)
Patient transported to CT 

## 2016-07-04 ENCOUNTER — Ambulatory Visit (INDEPENDENT_AMBULATORY_CARE_PROVIDER_SITE_OTHER): Payer: Self-pay | Admitting: Adult Health

## 2016-07-04 VITALS — BP 106/72 | Temp 98.6°F | Ht 73.0 in | Wt 182.7 lb

## 2016-07-04 DIAGNOSIS — S134XXA Sprain of ligaments of cervical spine, initial encounter: Secondary | ICD-10-CM

## 2016-07-04 MED ORDER — TIZANIDINE HCL 4 MG PO CAPS: 4.0000 mg | ORAL_CAPSULE | Freq: Two times a day (BID) | ORAL | 0 refills | Status: AC | PRN

## 2016-07-04 MED ORDER — KETOROLAC TROMETHAMINE 60 MG/2ML IM SOLN
60.0000 mg | Freq: Once | INTRAMUSCULAR | Status: AC
  Administered 2016-07-04: 60 mg via INTRAMUSCULAR
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
## 2016-07-06 ENCOUNTER — Ambulatory Visit: Payer: Self-pay | Admitting: Adult Health

## 2016-07-21 ENCOUNTER — Encounter (HOSPITAL_COMMUNITY): Payer: Self-pay | Admitting: Emergency Medicine

## 2016-07-21 ENCOUNTER — Ambulatory Visit (HOSPITAL_COMMUNITY)
Admission: EM | Admit: 2016-07-21 | Discharge: 2016-07-21 | Disposition: A | Discharge status: Home / Self Care | Payer: Self-pay | Attending: Internal Medicine | Admitting: Internal Medicine

## 2016-07-21 ENCOUNTER — Ambulatory Visit (INDEPENDENT_AMBULATORY_CARE_PROVIDER_SITE_OTHER): Payer: Self-pay

## 2016-07-21 DIAGNOSIS — S20212A Contusion of left front wall of thorax, initial encounter: Secondary | ICD-10-CM

## 2016-07-21 DIAGNOSIS — R0789 Other chest pain: Secondary | ICD-10-CM

## 2016-07-21 DIAGNOSIS — R0602 Shortness of breath: Secondary | ICD-10-CM

## 2016-07-21 MED ORDER — HYDROCODONE-ACETAMINOPHEN 5-325 MG PO TABS: 1.0000 | ORAL_TABLET | ORAL | 0 refills | Status: DC | PRN

## 2016-07-21 MED ORDER — HYDROCODONE-ACETAMINOPHEN 5-325 MG PO TABS
1.0000 | ORAL_TABLET | Freq: Once | ORAL | Status: AC
  Administered 2016-07-21: 1 via ORAL

## 2016-07-21 MED ORDER — HYDROCODONE-ACETAMINOPHEN 5-325 MG PO TABS
ORAL_TABLET | ORAL | Status: AC
  Filled 2016-07-21: qty 1
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
## 2016-07-21 NOTE — Discharge Instructions (Signed)
Place ice over the area of rib pain. Take medication as directed. He may have any in the ribs for a couple of weeks. Follow-up with your primary care doctor if needed. If you develop fever, unusual cough or shortness of breath seek medical attention promptly. For additional refills of pain medicine if needed , you will have see your primary care doctor.

## 2016-07-27 ENCOUNTER — Encounter: Payer: Self-pay | Admitting: Adult Health

## 2016-07-27 ENCOUNTER — Ambulatory Visit (INDEPENDENT_AMBULATORY_CARE_PROVIDER_SITE_OTHER): Payer: Self-pay | Admitting: Adult Health

## 2016-07-27 VITALS — BP 142/62 | Ht 73.0 in | Wt 173.6 lb

## 2016-07-27 DIAGNOSIS — R0789 Other chest pain: Secondary | ICD-10-CM
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
## 2016-08-24 ENCOUNTER — Ambulatory Visit: Payer: Self-pay | Admitting: Endocrinology

## 2016-08-24 DIAGNOSIS — Z0289 Encounter for other administrative examinations: Secondary | ICD-10-CM

## 2016-08-30 ENCOUNTER — Ambulatory Visit: Payer: Self-pay | Admitting: Endocrinology

## 2016-09-05 ENCOUNTER — Ambulatory Visit (INDEPENDENT_AMBULATORY_CARE_PROVIDER_SITE_OTHER): Payer: Self-pay | Admitting: Endocrinology

## 2016-09-05 ENCOUNTER — Encounter: Payer: No Typology Code available for payment source | Admitting: Nutrition

## 2016-09-05 ENCOUNTER — Encounter: Payer: Self-pay | Admitting: Endocrinology

## 2016-09-05 VITALS — BP 130/80 | HR 77 | Ht 73.0 in | Wt 187.4 lb

## 2016-09-05 DIAGNOSIS — E1065 Type 1 diabetes mellitus with hyperglycemia: Secondary | ICD-10-CM

## 2016-09-05 LAB — POCT GLYCOSYLATED HEMOGLOBIN (HGB A1C): Hemoglobin A1C: 7.1
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
## 2016-09-13 ENCOUNTER — Telehealth: Payer: Self-pay | Admitting: Adult Health

## 2016-09-13 NOTE — Telephone Encounter (Signed)
Error

## 2016-09-18 ENCOUNTER — Ambulatory Visit (INDEPENDENT_AMBULATORY_CARE_PROVIDER_SITE_OTHER): Payer: Self-pay | Admitting: Adult Health

## 2016-09-18 ENCOUNTER — Encounter: Payer: Self-pay | Admitting: Adult Health

## 2016-09-18 VITALS — BP 138/72 | HR 78 | Temp 98.2°F | Ht 73.0 in | Wt 177.3 lb

## 2016-09-18 DIAGNOSIS — R1013 Epigastric pain: Secondary | ICD-10-CM

## 2016-09-18 DIAGNOSIS — M79671 Pain in right foot: Secondary | ICD-10-CM
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
## 2016-09-18 NOTE — Patient Instructions (Signed)
WE NOW OFFER   [facility] FAST TRACK!!!  SAME DAY Appointments for ACUTE CARE  Such as: Sprains, Injuries, cuts, abrasions, rashes, muscle pain, joint pain, back pain Colds, flu, sore throats, headache, allergies, cough, fever  Ear pain, sinus and eye infections Abdominal pain, nausea, vomiting, diarrhea, upset stomach Animal/insect bites  3 Easy Ways to Schedule: Walk-In Scheduling Call in scheduling Mychart Sign-up: [email or web address]         

## 2016-09-27 ENCOUNTER — Ambulatory Visit (INDEPENDENT_AMBULATORY_CARE_PROVIDER_SITE_OTHER)
Admission: RE | Admit: 2016-09-27 | Discharge: 2016-09-27 | Disposition: A | Discharge status: Home / Self Care | Payer: Self-pay | Source: Ambulatory Visit | Attending: Adult Health | Admitting: Adult Health

## 2016-09-27 DIAGNOSIS — R1013 Epigastric pain: Secondary | ICD-10-CM

## 2016-09-28 ENCOUNTER — Encounter: Payer: Self-pay | Admitting: Adult Health

## 2016-09-28 ENCOUNTER — Ambulatory Visit (INDEPENDENT_AMBULATORY_CARE_PROVIDER_SITE_OTHER): Payer: Self-pay | Admitting: Adult Health

## 2016-09-28 VITALS — BP 100/76 | HR 95 | Temp 98.1°F | Ht 73.0 in | Wt 184.2 lb

## 2016-09-28 DIAGNOSIS — R0789 Other chest pain: Secondary | ICD-10-CM
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
## 2016-10-05 ENCOUNTER — Encounter: Payer: Self-pay | Admitting: Dietician

## 2016-10-05 ENCOUNTER — Encounter: Payer: No Typology Code available for payment source | Attending: Endocrinology | Admitting: Dietician

## 2016-10-05 DIAGNOSIS — E1165 Type 2 diabetes mellitus with hyperglycemia: Secondary | ICD-10-CM

## 2016-10-05 DIAGNOSIS — Z794 Long term (current) use of insulin: Secondary | ICD-10-CM | POA: Insufficient documentation

## 2016-10-05 DIAGNOSIS — Z713 Dietary counseling and surveillance: Secondary | ICD-10-CM | POA: Diagnosis not present
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
## 2016-10-05 NOTE — Patient Instructions (Signed)
Per Dr. Remus BlakeKumar's notes:  Lantus 25 units each am and pm  Novolog 4-7 units depending on the size of the meal.  Consider drinking fewer sweet drinks and change to more water. Consider a protein shake that does not have carbohydrate. Consider fewer sweets. Consider resuming exercise.

## 2016-11-28 ENCOUNTER — Telehealth: Payer: Self-pay | Admitting: Endocrinology

## 2016-11-28 ENCOUNTER — Other Ambulatory Visit: Payer: Self-pay

## 2016-11-28 ENCOUNTER — Telehealth: Payer: Self-pay

## 2016-11-28 MED ORDER — INSULIN GLARGINE 100 UNIT/ML ~~LOC~~ SOLN: 25.0000 [IU] | Freq: Two times a day (BID) | SUBCUTANEOUS | 2 refills | Status: DC

## 2016-11-28 MED ORDER — INSULIN GLARGINE 100 UNIT/ML ~~LOC~~ SOLN: 20.0000 [IU] | Freq: Every day | SUBCUTANEOUS | 1 refills | Status: DC

## 2016-11-28 NOTE — Telephone Encounter (Signed)
Called patient and let him know that I have sent over a revised prescription for the Lantus for 25 units BID to the [facility] at [address] I also let patient know that we have the 5 bottles of Novolog ready for him to pick up at our office and he stated that he will come to get this tomorrow on Thursday.

## 2016-11-28 NOTE — Telephone Encounter (Signed)
Checking on status of Lantus refill.  -LL

## 2016-11-28 NOTE — Telephone Encounter (Signed)
Patient's sig. Other called in reference to insulin glargine (LANTUS) 100 UNIT/ML injection falling out of fridge and bursting on the floor. [facility] [address]  [place]  [phone]  Please call patient with any questions.

## 2016-11-28 NOTE — Telephone Encounter (Signed)
Called and left a vm letting the patient know that we have his Novolog here in the fridge that he gets from patient assistance

## 2016-11-29 ENCOUNTER — Encounter (HOSPITAL_COMMUNITY): Payer: Self-pay

## 2016-11-29 ENCOUNTER — Emergency Department (HOSPITAL_COMMUNITY)
Admission: EM | Admit: 2016-11-29 | Discharge: 2016-11-30 | Disposition: A | Discharge status: Home / Self Care | Payer: No Typology Code available for payment source | Attending: Emergency Medicine | Admitting: Emergency Medicine

## 2016-11-29 ENCOUNTER — Emergency Department (HOSPITAL_COMMUNITY): Payer: No Typology Code available for payment source

## 2016-11-29 ENCOUNTER — Other Ambulatory Visit: Payer: Self-pay

## 2016-11-29 DIAGNOSIS — Y92410 Unspecified street and highway as the place of occurrence of the external cause: Secondary | ICD-10-CM | POA: Insufficient documentation

## 2016-11-29 DIAGNOSIS — Z794 Long term (current) use of insulin: Secondary | ICD-10-CM | POA: Insufficient documentation

## 2016-11-29 DIAGNOSIS — Y939 Activity, unspecified: Secondary | ICD-10-CM | POA: Diagnosis not present

## 2016-11-29 DIAGNOSIS — E11649 Type 2 diabetes mellitus with hypoglycemia without coma: Secondary | ICD-10-CM | POA: Diagnosis not present

## 2016-11-29 DIAGNOSIS — Y998 Other external cause status: Secondary | ICD-10-CM | POA: Insufficient documentation

## 2016-11-29 DIAGNOSIS — E162 Hypoglycemia, unspecified: Secondary | ICD-10-CM

## 2016-11-29 DIAGNOSIS — S2020XA Contusion of thorax, unspecified, initial encounter: Secondary | ICD-10-CM | POA: Diagnosis not present

## 2016-11-29 DIAGNOSIS — Z79899 Other long term (current) drug therapy: Secondary | ICD-10-CM | POA: Insufficient documentation

## 2016-11-29 DIAGNOSIS — S40012A Contusion of left shoulder, initial encounter: Secondary | ICD-10-CM | POA: Diagnosis not present

## 2016-11-29 DIAGNOSIS — R1084 Generalized abdominal pain: Secondary | ICD-10-CM | POA: Diagnosis not present

## 2016-11-29 DIAGNOSIS — F17210 Nicotine dependence, cigarettes, uncomplicated: Secondary | ICD-10-CM | POA: Diagnosis not present

## 2016-11-29 DIAGNOSIS — F1410 Cocaine abuse, uncomplicated: Secondary | ICD-10-CM | POA: Insufficient documentation

## 2016-11-29 LAB — CBC WITH DIFFERENTIAL/PLATELET
Basophils Absolute: 0 10*3/uL (ref 0.0–0.1)
Basophils Relative: 0 %
Eosinophils Absolute: 0 10*3/uL (ref 0.0–0.7)
Eosinophils Relative: 1 %
HEMATOCRIT: 38.7 % — AB (ref 39.0–52.0)
HEMOGLOBIN: 13.5 g/dL (ref 13.0–17.0)
LYMPHS PCT: 19 %
Lymphs Abs: 1.2 10*3/uL (ref 0.7–4.0)
MCH: 32.1 pg (ref 26.0–34.0)
MCHC: 34.9 g/dL (ref 30.0–36.0)
MCV: 91.9 fL (ref 78.0–100.0)
Monocytes Absolute: 0.5 10*3/uL (ref 0.1–1.0)
Monocytes Relative: 8 %
NEUTROS PCT: 72 %
Neutro Abs: 4.5 10*3/uL (ref 1.7–7.7)
Platelets: 180 10*3/uL (ref 150–400)
RBC: 4.21 MIL/uL — ABNORMAL LOW (ref 4.22–5.81)
RDW: 14 % (ref 11.5–15.5)
WBC: 6.1 10*3/uL (ref 4.0–10.5)

## 2016-11-29 LAB — URINALYSIS, ROUTINE W REFLEX MICROSCOPIC
BACTERIA UA: NONE SEEN
BILIRUBIN URINE: NEGATIVE
Glucose, UA: 50 mg/dL — AB
Hgb urine dipstick: NEGATIVE
KETONES UR: NEGATIVE mg/dL
LEUKOCYTES UA: NEGATIVE
Nitrite: NEGATIVE
PROTEIN: 100 mg/dL — AB
SQUAMOUS EPITHELIAL / LPF: NONE SEEN
Specific Gravity, Urine: 1.018 (ref 1.005–1.030)
pH: 5 (ref 5.0–8.0)

## 2016-11-29 LAB — CBG MONITORING, ED
GLUCOSE-CAPILLARY: 249 mg/dL — AB (ref 65–99)
GLUCOSE-CAPILLARY: 26 mg/dL — AB (ref 65–99)
GLUCOSE-CAPILLARY: 30 mg/dL — AB (ref 65–99)
GLUCOSE-CAPILLARY: 67 mg/dL (ref 65–99)
Glucose-Capillary: 89 mg/dL (ref 65–99)

## 2016-11-29 LAB — COMPREHENSIVE METABOLIC PANEL
ALK PHOS: 71 U/L (ref 38–126)
ALT: 18 U/L (ref 17–63)
ANION GAP: 8 (ref 5–15)
AST: 25 U/L (ref 15–41)
Albumin: 3.9 g/dL (ref 3.5–5.0)
BILIRUBIN TOTAL: 1.1 mg/dL (ref 0.3–1.2)
BUN: 12 mg/dL (ref 6–20)
CO2: 27 mmol/L (ref 22–32)
Calcium: 8.8 mg/dL — ABNORMAL LOW (ref 8.9–10.3)
Chloride: 103 mmol/L (ref 101–111)
Creatinine, Ser: 1.22 mg/dL (ref 0.61–1.24)
GFR calc non Af Amer: >60 mL/min (ref >60)
GLUCOSE: 55 mg/dL — AB (ref 65–99)
POTASSIUM: 3.4 mmol/L — AB (ref 3.5–5.1)
SODIUM: 138 mmol/L (ref 135–145)
TOTAL PROTEIN: 7.1 g/dL (ref 6.5–8.1)

## 2016-11-29 LAB — RAPID URINE DRUG SCREEN, HOSP PERFORMED
Amphetamines: NOT DETECTED
Barbiturates: NOT DETECTED
Benzodiazepines: NOT DETECTED
Cocaine: POSITIVE — AB
Opiates: NOT DETECTED
Tetrahydrocannabinol: POSITIVE — AB

## 2016-11-29 LAB — ETHANOL

## 2016-11-29 MED ORDER — IOPAMIDOL (ISOVUE-300) INJECTION 61%
INTRAVENOUS | Status: AC
  Administered 2016-11-29: 100 mL
  Filled 2016-11-29: qty 100

## 2016-11-29 MED ORDER — INSULIN GLARGINE 100 UNIT/ML ~~LOC~~ SOLN: 25.0000 [IU] | Freq: Two times a day (BID) | SUBCUTANEOUS | 2 refills | Status: DC

## 2016-11-29 NOTE — ED Notes (Signed)
Patient to CT with [employer] officer

## 2016-11-29 NOTE — ED Notes (Signed)
MD at bedside. 

## 2016-11-29 NOTE — Discharge Instructions (Signed)
Take Tylenol, or Motrin as needed for pain.  Take your medicine as prescribed.  Stay on a low carbohydrate diet.

## 2016-11-29 NOTE — ED Notes (Signed)
MD aware of trending blood sugars.  Wants patient to eat then check CBG in 10 min

## 2016-11-29 NOTE — ED Notes (Signed)
CBG 89 
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
## 2016-11-29 NOTE — ED Notes (Signed)
CBG was 30, MD Effie ShyWentz made aware.  Patient eating and drinking right now.  Will check CBG again once done eating

## 2016-11-29 NOTE — ED Notes (Signed)
CBG 67 MD aware, patient ambulatory able to use urinal at side of bed
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
## 2016-11-30 NOTE — ED Notes (Signed)
Patient Alert and oriented X4. Stable and ambulatory. Patient verbalized understanding of the discharge instructions.  Patient belongings were taken by the patient. Patient in police custody upon leaving

## 2016-12-03 ENCOUNTER — Other Ambulatory Visit: Payer: Self-pay

## 2016-12-05 ENCOUNTER — Other Ambulatory Visit (INDEPENDENT_AMBULATORY_CARE_PROVIDER_SITE_OTHER): Payer: Self-pay

## 2016-12-05 DIAGNOSIS — E1065 Type 1 diabetes mellitus with hyperglycemia: Secondary | ICD-10-CM

## 2016-12-05 LAB — BASIC METABOLIC PANEL
BUN: 19 mg/dL (ref 6–23)
CHLORIDE: 100 meq/L (ref 96–112)
CO2: 32 meq/L (ref 19–32)
Calcium: 9.4 mg/dL (ref 8.4–10.5)
Creatinine, Ser: 1.33 mg/dL (ref 0.40–1.50)
GFR: 77.35 mL/min (ref >60.00)
GLUCOSE: 380 mg/dL — AB (ref 70–99)
Potassium: 4.1 mEq/L (ref 3.5–5.1)
SODIUM: 135 meq/L (ref 135–145)

## 2016-12-05 LAB — HEMOGLOBIN A1C: HEMOGLOBIN A1C: 6.8 % — AB (ref 4.6–6.5)

## 2016-12-06 ENCOUNTER — Ambulatory Visit: Payer: Self-pay | Admitting: Endocrinology

## 2016-12-10 ENCOUNTER — Encounter: Payer: Self-pay | Admitting: Endocrinology

## 2016-12-10 VITALS — BP 138/84 | HR 103 | Ht 73.0 in | Wt 184.8 lb

## 2016-12-10 NOTE — Progress Notes (Signed)
-  Patient ID: Jeremy Nguyen, male   DOB: June 30, 1980, 36 y.o.   MRN: [id number]           Erroneous encounter  Review of Systems    This encounter was created in error - please disregard.

## 2016-12-11 ENCOUNTER — Ambulatory Visit: Payer: Self-pay | Admitting: Endocrinology

## 2016-12-11 DIAGNOSIS — Z0289 Encounter for other administrative examinations: Secondary | ICD-10-CM

## 2016-12-18 ENCOUNTER — Ambulatory Visit: Payer: Self-pay | Admitting: Endocrinology

## 2016-12-18 ENCOUNTER — Ambulatory Visit: Payer: Self-pay | Admitting: Nutrition

## 2016-12-18 DIAGNOSIS — Z0289 Encounter for other administrative examinations: Secondary | ICD-10-CM

## 2017-01-07 ENCOUNTER — Encounter: Payer: No Typology Code available for payment source | Attending: Endocrinology | Admitting: Nutrition

## 2017-01-07 DIAGNOSIS — Z794 Long term (current) use of insulin: Secondary | ICD-10-CM

## 2017-01-07 DIAGNOSIS — E1165 Type 2 diabetes mellitus with hyperglycemia: Secondary | ICD-10-CM

## 2017-01-07 DIAGNOSIS — Z029 Encounter for administrative examinations, unspecified: Secondary | ICD-10-CM | POA: Insufficient documentation
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
## 2017-01-09 NOTE — Patient Instructions (Signed)
Do not adjust the AM dose of Lantus daily

## 2017-02-14 ENCOUNTER — Telehealth: Payer: Self-pay

## 2017-02-14 NOTE — Telephone Encounter (Signed)
Called patient and left a voice message to let him know that his Novolog Insulin from the patient assistance program is here in our office and he can come by to pick it up.

## 2017-03-07 ENCOUNTER — Telehealth: Payer: Self-pay | Admitting: Endocrinology

## 2017-03-14 ENCOUNTER — Encounter (HOSPITAL_COMMUNITY): Payer: Self-pay | Admitting: *Deleted

## 2017-03-14 ENCOUNTER — Emergency Department (HOSPITAL_COMMUNITY)
Admission: EM | Admit: 2017-03-14 | Discharge: 2017-03-14 | Disposition: A | Discharge status: Home / Self Care | Payer: Self-pay | Attending: Emergency Medicine | Admitting: Emergency Medicine

## 2017-03-14 ENCOUNTER — Other Ambulatory Visit: Payer: Self-pay

## 2017-03-14 ENCOUNTER — Ambulatory Visit (HOSPITAL_COMMUNITY)
Admission: EM | Admit: 2017-03-14 | Discharge: 2017-03-14 | Disposition: A | Discharge status: Home / Self Care | Payer: No Typology Code available for payment source

## 2017-03-14 DIAGNOSIS — Z794 Long term (current) use of insulin: Secondary | ICD-10-CM | POA: Insufficient documentation

## 2017-03-14 DIAGNOSIS — Z77098 Contact with and (suspected) exposure to other hazardous, chiefly nonmedicinal, chemicals: Secondary | ICD-10-CM | POA: Insufficient documentation

## 2017-03-14 DIAGNOSIS — F17210 Nicotine dependence, cigarettes, uncomplicated: Secondary | ICD-10-CM | POA: Insufficient documentation

## 2017-03-14 DIAGNOSIS — E119 Type 2 diabetes mellitus without complications: Secondary | ICD-10-CM | POA: Insufficient documentation

## 2017-03-14 MED ORDER — ALBUTEROL SULFATE HFA 108 (90 BASE) MCG/ACT IN AERS
2.0000 | INHALATION_SPRAY | RESPIRATORY_TRACT | Status: DC | PRN
  Administered 2017-03-14: 2 via RESPIRATORY_TRACT
  Filled 2017-03-14: qty 6.7

## 2017-03-14 MED ORDER — FLUTICASONE PROPIONATE 50 MCG/ACT NA SUSP: 1.0000 | Freq: Every day | NASAL | 0 refills | Status: DC

## 2017-03-14 NOTE — ED Notes (Signed)
Pt placed on NRB mask for comfort. O2 sats noted to be 100% on same and 98% on RA

## 2017-03-14 NOTE — Discharge Instructions (Addendum)
Please use flonase one spray on each side of the nose daily as needed for congestion.  You may use albuterol 2 puffs every 4 hours as needed for trouble breathing.  Avoid mixing bleach and ammonia as cleaning solution to avoid chemical injury.  Always clean in a well ventilated area.  Return if you have any concerns.

## 2017-03-14 NOTE — ED Triage Notes (Signed)
Pt states was cleaning his uncles house at 5 am and he mixed bleach and amonia.  Around 7 am he began experience chest pain, sob, burning to skin.  Airway patent.  Able to speak in full sentences.

## 2017-03-14 NOTE — ED Notes (Signed)
EDP at bedside  

## 2017-03-14 NOTE — ED Notes (Signed)
Pt advised O2 from NRB was helping cool his throat that is burning.

## 2017-03-14 NOTE — ED Notes (Addendum)
Discharge vitals in and cup of ice water given to pt.
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
## 2017-04-11 ENCOUNTER — Other Ambulatory Visit: Payer: Self-pay | Admitting: Endocrinology

## 2017-04-11 ENCOUNTER — Other Ambulatory Visit (INDEPENDENT_AMBULATORY_CARE_PROVIDER_SITE_OTHER): Payer: Self-pay

## 2017-04-11 DIAGNOSIS — Z794 Long term (current) use of insulin: Principal | ICD-10-CM

## 2017-04-11 DIAGNOSIS — E1165 Type 2 diabetes mellitus with hyperglycemia: Secondary | ICD-10-CM

## 2017-04-11 LAB — HEMOGLOBIN A1C: Hgb A1c MFr Bld: 7.6 % — ABNORMAL HIGH (ref 4.6–6.5)

## 2017-04-11 LAB — MICROALBUMIN / CREATININE URINE RATIO
Creatinine,U: 279.6 mg/dL
Microalb Creat Ratio: 34.2 mg/g — ABNORMAL HIGH (ref 0.0–30.0)
Microalb, Ur: 95.7 mg/dL — ABNORMAL HIGH (ref 0.0–1.9)

## 2017-04-11 LAB — GLUCOSE, RANDOM: GLUCOSE: 160 mg/dL — AB (ref 70–99)

## 2017-04-12 ENCOUNTER — Other Ambulatory Visit: Payer: Self-pay

## 2017-04-16 ENCOUNTER — Ambulatory Visit (INDEPENDENT_AMBULATORY_CARE_PROVIDER_SITE_OTHER): Payer: Self-pay | Admitting: Endocrinology

## 2017-04-16 ENCOUNTER — Encounter: Payer: Self-pay | Admitting: Endocrinology

## 2017-04-16 VITALS — BP 109/69 | HR 84 | Temp 97.9°F | Ht 73.0 in | Wt 174.8 lb

## 2017-04-16 DIAGNOSIS — E1065 Type 1 diabetes mellitus with hyperglycemia: Secondary | ICD-10-CM
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
## 2017-04-16 NOTE — Patient Instructions (Addendum)
5 units Novolog when eating out  Add 1 unit extra Novolog for 200, 2 units for 250 and 3 units if > 300  Stay on 12 Lantus 2x daily

## 2017-04-25 ENCOUNTER — Telehealth: Payer: Self-pay | Admitting: Endocrinology

## 2017-04-25 ENCOUNTER — Telehealth: Payer: Self-pay | Admitting: Adult Health

## 2017-04-25 NOTE — Telephone Encounter (Signed)
Pts mother came into office and is stating that the pt needs a letter from Dr Lucianne Musskumar stating his day to day limitations,  Also stated that the pt needs to get a referral sent over to a dr that specializes in memory loss.  Please advise
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
## 2017-04-25 NOTE — Telephone Encounter (Signed)
Please advise 

## 2017-04-25 NOTE — Telephone Encounter (Signed)
He will have to be referred by his PCP to neurologist Not clear what kind of note he needs or what needs to be stated for what purpose, if it is nothing to do with diabetes he can get it from PCP

## 2017-04-25 NOTE — Telephone Encounter (Signed)
His last visit indicated that his blood sugars are high and did not have any low blood sugar documented, he needs to check his sugar when it is low and bring his meter for review

## 2017-04-25 NOTE — Telephone Encounter (Signed)
The patient needs a letter for disability. His day to day limitations. Need more specification on day to day limitations for disability hearing. Needs the letter ASAP.

## 2017-04-25 NOTE — Telephone Encounter (Signed)
Spoke to Ms. Jeremy Nguyen.  She explained the pt would like a referral to a neurologist for memory loss.  Also wanted to discuss disability.  Pt has a hearing for disability coming up soon.  Could not tell me exact date.  I informed her that Jeremy Nguyen has not written for disability.  She thinks disability needs to be discussed.  Pt scheduled to see Jeremy Nguyen on 05/01/17 @ 1 PM (30 min appt) and told to arrive at 12:45 for check in.  Will [facility] to [facility] for review.

## 2017-04-26 NOTE — Telephone Encounter (Signed)
Please advise on what I need to tell the mother. She still wants a letter per my previous message.

## 2017-04-29 NOTE — Telephone Encounter (Signed)
LMTCB
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
## 2017-04-30 NOTE — Telephone Encounter (Signed)
Patient mother is calling you back

## 2017-05-01 ENCOUNTER — Ambulatory Visit (INDEPENDENT_AMBULATORY_CARE_PROVIDER_SITE_OTHER): Payer: Self-pay | Admitting: Adult Health

## 2017-05-01 ENCOUNTER — Encounter: Payer: Self-pay | Admitting: Adult Health

## 2017-05-01 VITALS — BP 120/70 | Temp 98.3°F | Wt 179.0 lb

## 2017-05-01 DIAGNOSIS — W3400XA Accidental discharge from unspecified firearms or gun, initial encounter: Secondary | ICD-10-CM | POA: Insufficient documentation

## 2017-05-01 DIAGNOSIS — R4189 Other symptoms and signs involving cognitive functions and awareness: Secondary | ICD-10-CM

## 2017-05-01 NOTE — Telephone Encounter (Signed)
Patients mother came in our office and I gave her the message from Dr. Lucianne MussKumar and she is taking Jeremy Nguyen to Dr. Selena Battenody today and they will get [place] [facility] about his [place]'s [place] being taken away due to medical condition and she will also bring his glucose meter in to download for an update on his low sugar readings. Both of these will need to be given to Dr. Lucianne MussKumar so that he can proceed with the letter.
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
## 2017-05-03 NOTE — Telephone Encounter (Signed)
Pending paper work being dropped off

## 2017-05-03 NOTE — Telephone Encounter (Signed)
Can someone make sure this is complete for the patient?   His mother is supposed to bring back some of the [facility] or bring his glucose meter to download for his readings to show Dr. Lucianne MussKumar his low readings so he can complete a letter for disability.

## 2017-05-29 ENCOUNTER — Ambulatory Visit: Payer: Self-pay | Admitting: Adult Health

## 2017-06-04 ENCOUNTER — Ambulatory Visit: Payer: Self-pay | Admitting: Adult Health

## 2017-06-06 ENCOUNTER — Encounter: Payer: Self-pay | Admitting: Adult Health

## 2017-06-06 ENCOUNTER — Ambulatory Visit (INDEPENDENT_AMBULATORY_CARE_PROVIDER_SITE_OTHER): Payer: Self-pay | Admitting: Adult Health

## 2017-06-06 VITALS — BP 118/66 | Temp 98.1°F | Wt 175.0 lb

## 2017-06-06 DIAGNOSIS — R4189 Other symptoms and signs involving cognitive functions and awareness: Secondary | ICD-10-CM
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
## 2017-07-15 DIAGNOSIS — E1065 Type 1 diabetes mellitus with hyperglycemia: Secondary | ICD-10-CM | POA: Insufficient documentation
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
## 2017-07-16 ENCOUNTER — Ambulatory Visit (INDEPENDENT_AMBULATORY_CARE_PROVIDER_SITE_OTHER): Payer: Self-pay | Admitting: Endocrinology

## 2017-07-16 ENCOUNTER — Encounter: Payer: Self-pay | Admitting: Endocrinology

## 2017-07-16 VITALS — BP 122/80 | HR 84 | Ht 73.0 in | Wt 174.0 lb

## 2017-07-16 DIAGNOSIS — E1065 Type 1 diabetes mellitus with hyperglycemia: Secondary | ICD-10-CM

## 2017-07-16 DIAGNOSIS — R413 Other amnesia: Secondary | ICD-10-CM

## 2017-07-16 LAB — POCT GLYCOSYLATED HEMOGLOBIN (HGB A1C): HEMOGLOBIN A1C: 8

## 2017-07-16 NOTE — Patient Instructions (Addendum)
Take Lantus 12 units twice daily  If sugar high before a meal or bedtime take 200=1 unit 250 take 2 and 300 take 3  This is on on top of Novolog for food coverage, this needs to be before meals always  Keep record of insulin take

## 2017-07-18 ENCOUNTER — Encounter: Payer: Self-pay | Admitting: Psychology

## 2017-07-24 ENCOUNTER — Telehealth: Payer: Self-pay | Admitting: Family Medicine

## 2017-07-24 NOTE — Telephone Encounter (Signed)
Copied from CRM [id number]. Topic: Inquiry >> Jul 24, 2017  1:50 PM Landry Mellow wrote: Reason for CRM: pt needs to have letter that was written by cory about job duties.  Pt has hearing in the am and needs this ready today, please call [phone].  Pt needs to pick up today. Pt lost letter

## 2017-07-24 NOTE — Telephone Encounter (Signed)
Pt's mother notified to pick up at the front desk.
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
## 2017-09-05 ENCOUNTER — Telehealth: Payer: Self-pay

## 2017-09-05 ENCOUNTER — Ambulatory Visit (INDEPENDENT_AMBULATORY_CARE_PROVIDER_SITE_OTHER): Payer: Self-pay | Admitting: Adult Health

## 2017-09-05 ENCOUNTER — Telehealth: Payer: Self-pay | Admitting: Endocrinology

## 2017-09-05 ENCOUNTER — Encounter: Payer: Self-pay | Admitting: Adult Health

## 2017-09-05 ENCOUNTER — Ambulatory Visit: Payer: Self-pay | Admitting: Adult Health

## 2017-09-05 VITALS — BP 148/94 | Temp 98.4°F | Wt 168.0 lb

## 2017-09-05 DIAGNOSIS — M545 Low back pain, unspecified: Secondary | ICD-10-CM

## 2017-09-05 MED ORDER — CYCLOBENZAPRINE HCL 10 MG PO TABS: 10.0000 mg | ORAL_TABLET | Freq: Three times a day (TID) | ORAL | 0 refills | Status: AC | PRN

## 2017-09-05 NOTE — Telephone Encounter (Signed)
This sugars are not much different than when he saw Dr. Lucianne MussKumar in January of this year...  Per his last note, patient is on the following regimen: Lantus 12 bid  Novolog 3-5 units before meals   If he is indeed on the above regimen, he will need to change to the following regimen: Lantus 15 units 2x a day Novolog 7-9 units before meals  Let us do this for now and he will need to see Dr. Lucianne MussKumar when he returns.

## 2017-09-05 NOTE — Telephone Encounter (Signed)
Spoke with the patient today and he stated he needs to be seen today as urgent visit because his blood sugar is out of control- I got some of the readings from this week and they are: Tue- 101 fasting am          485 after breakfast         247 afternoon         331 5:30 pm         123 8:30 pm Wed- 319 fasting am           349 3:20 pm           222 7:50 pm           99 10 pm thur- 283 fasting am         263 3:40 pm         295 11:30 pm Today- 135 fasting am  Can you see the patient today, should he go somewhere else, or can you advise on any medication changes in Dr. Remus BlakeKumar's absence

## 2017-09-05 NOTE — Telephone Encounter (Signed)
Attempted to call pt and phone went straight to voicemail.
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
## 2017-09-05 NOTE — Telephone Encounter (Signed)
Patient would like a call back to discuss his high sugar readings.  Patient stated that his lawyer is needing his recent labs To put his court date off.   Please advise

## 2017-09-05 NOTE — Telephone Encounter (Signed)
Attempted to pt 2 additional times. Pt phone went straight to voicemail and was unable to leave VM.

## 2017-09-24 ENCOUNTER — Telehealth: Payer: Self-pay

## 2017-09-24 NOTE — Telephone Encounter (Signed)
Called Jeremy Nguyen and informed her that paperwork was received and Dr. Lucianne MussKumar has already filled it out and it just needs to be faxed. She verbalized understanding.

## 2017-09-24 NOTE — Telephone Encounter (Signed)
[facility] called today to see if we have received patient assistance paperwork for Novolog for this patient- please let her know the status of this paperwork by calling [phone]

## 2017-10-10 ENCOUNTER — Telehealth: Payer: Self-pay | Admitting: Emergency Medicine

## 2017-10-10 NOTE — Telephone Encounter (Signed)
FYI: PCP has faxed over patient lab results. Pt has elevated bilirubin and doesn't know if he would like an additional labs done. He has an appt on Monday 10/14/17. Thanks.

## 2017-10-11 NOTE — Telephone Encounter (Signed)
I have not seen any labs come through

## 2017-10-14 ENCOUNTER — Encounter: Payer: Self-pay | Admitting: Endocrinology

## 2017-10-14 ENCOUNTER — Ambulatory Visit: Payer: Self-pay | Admitting: Endocrinology

## 2017-10-14 VITALS — BP 124/86 | HR 87 | Ht 73.0 in | Wt 176.0 lb

## 2017-10-14 DIAGNOSIS — R413 Other amnesia: Secondary | ICD-10-CM

## 2017-10-14 DIAGNOSIS — E1065 Type 1 diabetes mellitus with hyperglycemia: Secondary | ICD-10-CM

## 2017-10-14 LAB — POCT GLYCOSYLATED HEMOGLOBIN (HGB A1C): Hemoglobin A1C: 7 % — AB (ref 4.0–5.6)
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
## 2017-10-14 NOTE — Patient Instructions (Addendum)
No juice drinks, otherwise 2-3 units for these drinks

## 2017-10-17 ENCOUNTER — Ambulatory Visit: Payer: Self-pay | Admitting: Endocrinology
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
## 2017-11-15 ENCOUNTER — Telehealth: Payer: Self-pay | Admitting: Endocrinology

## 2017-11-15 NOTE — Telephone Encounter (Signed)
Pt is requesting a letter for his lawyer stating he is having lab work done in December.

## 2017-11-19 NOTE — Telephone Encounter (Signed)
Called and left VM informing patient that more information is needed. Upon return call please see what needs to be included in this note for his lawyer.

## 2017-12-17 ENCOUNTER — Encounter

## 2017-12-17 ENCOUNTER — Encounter: Payer: Self-pay | Admitting: Psychology
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
## 2017-12-25 ENCOUNTER — Encounter: Payer: Self-pay | Admitting: Psychology

## 2017-12-31 ENCOUNTER — Encounter
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
## 2018-01-21 ENCOUNTER — Encounter: Payer: Self-pay | Admitting: Psychology

## 2018-01-21 ENCOUNTER — Encounter

## 2018-02-17 ENCOUNTER — Ambulatory Visit: Payer: Self-pay | Admitting: Endocrinology

## 2018-02-17 ENCOUNTER — Encounter: Payer: Self-pay | Admitting: Endocrinology

## 2018-02-17 DIAGNOSIS — Z0289 Encounter for other administrative examinations: Secondary | ICD-10-CM

## 2018-02-19 ENCOUNTER — Encounter (HOSPITAL_COMMUNITY): Payer: Self-pay | Admitting: Emergency Medicine

## 2018-02-19 ENCOUNTER — Other Ambulatory Visit: Payer: Self-pay

## 2018-02-19 ENCOUNTER — Emergency Department (HOSPITAL_COMMUNITY)
Admission: EM | Admit: 2018-02-19 | Discharge: 2018-02-19 | Disposition: A | Discharge status: Home / Self Care | Payer: Self-pay | Attending: Emergency Medicine | Admitting: Emergency Medicine

## 2018-02-19 ENCOUNTER — Emergency Department (HOSPITAL_COMMUNITY): Payer: Self-pay

## 2018-02-19 DIAGNOSIS — E1140 Type 2 diabetes mellitus with diabetic neuropathy, unspecified: Secondary | ICD-10-CM | POA: Insufficient documentation

## 2018-02-19 DIAGNOSIS — F17210 Nicotine dependence, cigarettes, uncomplicated: Secondary | ICD-10-CM | POA: Insufficient documentation

## 2018-02-19 DIAGNOSIS — R1031 Right lower quadrant pain: Secondary | ICD-10-CM | POA: Insufficient documentation

## 2018-02-19 DIAGNOSIS — Z794 Long term (current) use of insulin: Secondary | ICD-10-CM | POA: Insufficient documentation

## 2018-02-19 LAB — CBG MONITORING, ED: GLUCOSE-CAPILLARY: 185 mg/dL — AB (ref 70–99)

## 2018-02-19 LAB — URINALYSIS, ROUTINE W REFLEX MICROSCOPIC
BILIRUBIN URINE: NEGATIVE
Glucose, UA: NEGATIVE mg/dL
HGB URINE DIPSTICK: NEGATIVE
Ketones, ur: NEGATIVE mg/dL
Leukocytes, UA: NEGATIVE
Nitrite: NEGATIVE
PROTEIN: NEGATIVE mg/dL
SPECIFIC GRAVITY, URINE: 1.001 — AB (ref 1.005–1.030)
pH: 7 (ref 5.0–8.0)

## 2018-02-19 LAB — CBC WITH DIFFERENTIAL/PLATELET
Abs Immature Granulocytes: 0.03 10*3/uL (ref 0.00–0.07)
Basophils Absolute: 0.1 10*3/uL (ref 0.0–0.1)
Basophils Relative: 1 %
EOS ABS: 0 10*3/uL (ref 0.0–0.5)
Eosinophils Relative: 0 %
HCT: 40.6 % (ref 39.0–52.0)
Hemoglobin: 14.3 g/dL (ref 13.0–17.0)
Immature Granulocytes: 1 %
Lymphocytes Relative: 26 %
Lymphs Abs: 1.5 10*3/uL (ref 0.7–4.0)
MCH: 33.6 pg (ref 26.0–34.0)
MCHC: 35.2 g/dL (ref 30.0–36.0)
MCV: 95.3 fL (ref 80.0–100.0)
MONO ABS: 0.6 10*3/uL (ref 0.1–1.0)
Monocytes Relative: 10 %
Neutro Abs: 3.7 10*3/uL (ref 1.7–7.7)
Neutrophils Relative %: 62 %
PLATELETS: 204 10*3/uL (ref 150–400)
RBC: 4.26 MIL/uL (ref 4.22–5.81)
RDW: 13.1 % (ref 11.5–15.5)
WBC: 6 10*3/uL (ref 4.0–10.5)
nRBC: 0 % (ref 0.0–0.2)

## 2018-02-19 LAB — COMPREHENSIVE METABOLIC PANEL
ALBUMIN: 4.5 g/dL (ref 3.5–5.0)
ALT: 18 U/L (ref 0–44)
ANION GAP: 9 (ref 5–15)
AST: 20 U/L (ref 15–41)
Alkaline Phosphatase: 81 U/L (ref 38–126)
BUN: 16 mg/dL (ref 6–20)
CHLORIDE: 98 mmol/L (ref 98–111)
CO2: 29 mmol/L (ref 22–32)
Calcium: 9.2 mg/dL (ref 8.9–10.3)
Creatinine, Ser: 1.23 mg/dL (ref 0.61–1.24)
GFR calc Af Amer: >60 mL/min (ref >60)
GFR calc non Af Amer: >60 mL/min (ref >60)
GLUCOSE: 187 mg/dL — AB (ref 70–99)
POTASSIUM: 3.7 mmol/L (ref 3.5–5.1)
Sodium: 136 mmol/L (ref 135–145)
Total Bilirubin: 0.9 mg/dL (ref 0.3–1.2)
Total Protein: 8.1 g/dL (ref 6.5–8.1)

## 2018-02-19 MED ORDER — MORPHINE SULFATE (PF) 4 MG/ML IV SOLN
4.0000 mg | Freq: Once | INTRAVENOUS | Status: AC
  Administered 2018-02-19: 4 mg via INTRAVENOUS
  Filled 2018-02-19: qty 1

## 2018-02-19 MED ORDER — IOPAMIDOL (ISOVUE-300) INJECTION 61%
100.0000 mL | Freq: Once | INTRAVENOUS | Status: AC | PRN
  Administered 2018-02-19: 100 mL via INTRAVENOUS

## 2018-02-19 MED ORDER — SODIUM CHLORIDE (PF) 0.9 % IJ SOLN
INTRAMUSCULAR | Status: AC
  Filled 2018-02-19: qty 50

## 2018-02-19 MED ORDER — SODIUM CHLORIDE 0.9 % IV BOLUS
500.0000 mL | Freq: Once | INTRAVENOUS | Status: AC
  Administered 2018-02-19: 500 mL via INTRAVENOUS

## 2018-02-19 MED ORDER — METHOCARBAMOL 500 MG PO TABS: 500.0000 mg | ORAL_TABLET | Freq: Two times a day (BID) | ORAL | 0 refills | Status: DC

## 2018-02-19 MED ORDER — IOPAMIDOL (ISOVUE-300) INJECTION 61%
INTRAVENOUS | Status: AC
  Filled 2018-02-19: qty 100
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
## 2018-02-19 NOTE — ED Notes (Signed)
Patient transported to CT 

## 2018-02-19 NOTE — Discharge Instructions (Signed)
Your workup today was without any significant findings.  Please follow up with your primary care provider.  Please see attached handout.  If you develop any abdominal pain, fever, pain in your testicles, pain with urination, penile discharge, a rash or other concerning/worsening symptoms please return to the emergency department for further evaluation.   Muscle relaxants:  These medications can help with muscle tightness that is a cause of musculoskeletal pain.  Most of these medications can cause drowsiness, and it is not safe to drive or use dangerous machinery while taking them. They are primarily helpful when taken at night before sleep.

## 2018-02-19 NOTE — ED Notes (Signed)
Patient given discharge teaching and verbalized understanding. Patient ambulated out of ED with a steady gait. 

## 2018-02-25 ENCOUNTER — Telehealth: Payer: Self-pay | Admitting: Endocrinology

## 2018-02-25 NOTE — Telephone Encounter (Signed)
Patient dismissed from [facility] by Reather LittlerAjay Kumar MD, effective 02/17/18. Dismissal letter sent out by 1st class mail. LM

## 2018-03-27 ENCOUNTER — Telehealth: Payer: Self-pay | Admitting: Endocrinology

## 2018-03-27 NOTE — Telephone Encounter (Signed)
Patients mother called stating they picked up insulin aspart (NOVOLOG) 100 UNIT/ML injection, insulin glargine (LANTUS) 100 UNIT/ML injection from [facility] department and believe the instructions are incorrect. Please Advise with correct directions, thanks

## 2018-03-27 NOTE — Telephone Encounter (Signed)
Was unable to get in touch with [facility] department. Attempted to call patient as well with no success.  Patient has missed multiple appointment as well as canceled multiple times.

## 2018-03-27 NOTE — Telephone Encounter (Signed)
[facility] called in regards to the sam medications and requested recent appointment with Dr. Lucianne Muss, stated 09/2017 but upcoming appointment on 04/01/2018.  They also stated patient is having low readings and night sweats.  Please Advise, thanks

## 2018-04-01 ENCOUNTER — Ambulatory Visit: Payer: Self-pay | Admitting: Endocrinology

## 2018-04-23 ENCOUNTER — Telehealth: Payer: Self-pay | Admitting: Endocrinology

## 2018-04-23 ENCOUNTER — Other Ambulatory Visit: Payer: Self-pay

## 2018-04-23 MED ORDER — INSULIN GLARGINE 100 UNIT/ML ~~LOC~~ SOLN: 25.0000 [IU] | Freq: Two times a day (BID) | SUBCUTANEOUS | 2 refills | Status: DC

## 2018-04-23 MED ORDER — INSULIN ASPART 100 UNIT/ML ~~LOC~~ SOLN: 3.0000 [IU] | Freq: Three times a day (TID) | SUBCUTANEOUS | 3 refills | Status: DC

## 2018-04-23 MED ORDER — GLUCOSE BLOOD VI STRP: ORAL_STRIP | 3 refills | Status: DC

## 2018-04-23 NOTE — Telephone Encounter (Signed)
Patient's mother Britta Mccreedy ph# [phone] called requesting Dr. Lucianne Muss write out instructions for patient's Novolog and Lantus so that Britta Mccreedy can pick up instructions and take to [facility] so that they administer patient's medication properly. Call Mom Britta Mccreedy at ph# [phone] when written instructions are ready for pickup.

## 2018-04-23 NOTE — Telephone Encounter (Signed)
Called pt's mother and informed her that a non-signed prescription (for directions only) could be picked up at this office at her earliest convenience.

## 2018-06-10 ENCOUNTER — Encounter: Payer: Self-pay | Admitting: Psychology

## 2018-06-19 ENCOUNTER — Encounter: Payer: Self-pay | Admitting: Psychology
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
## 2019-12-08 ENCOUNTER — Ambulatory Visit: Payer: Self-pay | Admitting: Adult Health

## 2019-12-22 ENCOUNTER — Encounter: Payer: Self-pay | Admitting: Adult Health

## 2019-12-22 ENCOUNTER — Other Ambulatory Visit: Payer: Self-pay

## 2019-12-22 ENCOUNTER — Ambulatory Visit (INDEPENDENT_AMBULATORY_CARE_PROVIDER_SITE_OTHER): Payer: Self-pay | Admitting: Adult Health

## 2019-12-22 VITALS — BP 110/70 | HR 84 | Temp 98.0°F | Ht 73.0 in | Wt 199.0 lb

## 2019-12-22 DIAGNOSIS — E1165 Type 2 diabetes mellitus with hyperglycemia: Secondary | ICD-10-CM

## 2019-12-22 DIAGNOSIS — E1065 Type 1 diabetes mellitus with hyperglycemia: Secondary | ICD-10-CM

## 2019-12-22 DIAGNOSIS — M6530 Trigger finger, unspecified finger: Secondary | ICD-10-CM

## 2019-12-22 DIAGNOSIS — Z0000 Encounter for general adult medical examination without abnormal findings: Secondary | ICD-10-CM

## 2019-12-22 LAB — POCT URINALYSIS DIPSTICK
Bilirubin, UA: NEGATIVE
Blood, UA: NEGATIVE
Glucose, UA: NEGATIVE
Ketones, UA: NEGATIVE
Leukocytes, UA: NEGATIVE
Nitrite, UA: NEGATIVE
Protein, UA: POSITIVE — AB
Spec Grav, UA: 1.025 (ref 1.010–1.025)
Urobilinogen, UA: 1 E.U./dL
pH, UA: 6 (ref 5.0–8.0)

## 2019-12-22 NOTE — Patient Instructions (Signed)
It was great seeing you today and I am glad you are out.   We will do blood work on you today   I am also going to refer you to a hand specialist. They will call you to schedule   I will call you tomorrow !
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
## 2019-12-23 LAB — COMPLETE METABOLIC PANEL WITH GFR
AG Ratio: 1.3 (calc) (ref 1.0–2.5)
ALT: 15 U/L (ref 9–46)
AST: 21 U/L (ref 10–40)
Albumin: 4.2 g/dL (ref 3.6–5.1)
Alkaline phosphatase (APISO): 84 U/L (ref 36–130)
BUN/Creatinine Ratio: 14 (calc) (ref 6–22)
BUN: 20 mg/dL (ref 7–25)
CO2: 30 mmol/L (ref 20–32)
Calcium: 9.7 mg/dL (ref 8.6–10.3)
Chloride: 99 mmol/L (ref 98–110)
Creat: 1.42 mg/dL — ABNORMAL HIGH (ref 0.60–1.35)
GFR, Est African American: 71 mL/min/{1.73_m2} (ref >=60)
GFR, Est Non African American: 61 mL/min/{1.73_m2} (ref >=60)
Globulin: 3.2 g/dL (calc) (ref 1.9–3.7)
Glucose, Bld: 69 mg/dL (ref 65–99)
Potassium: 4.2 mmol/L (ref 3.5–5.3)
Sodium: 139 mmol/L (ref 135–146)
Total Bilirubin: 1.1 mg/dL (ref 0.2–1.2)
Total Protein: 7.4 g/dL (ref 6.1–8.1)

## 2019-12-23 LAB — CBC WITH DIFFERENTIAL/PLATELET
Absolute Monocytes: 745 cells/uL (ref 200–950)
Basophils Absolute: 28 cells/uL (ref 0–200)
Basophils Relative: 0.5 %
Eosinophils Absolute: 157 cells/uL (ref 15–500)
Eosinophils Relative: 2.8 %
HCT: 38.8 % (ref 38.5–50.0)
Hemoglobin: 13.1 g/dL — ABNORMAL LOW (ref 13.2–17.1)
Lymphs Abs: 1904 cells/uL (ref 850–3900)
MCH: 32.8 pg (ref 27.0–33.0)
MCHC: 33.8 g/dL (ref 32.0–36.0)
MCV: 97 fL (ref 80.0–100.0)
MPV: 12.3 fL (ref 7.5–12.5)
Monocytes Relative: 13.3 %
Neutro Abs: 2766 cells/uL (ref 1500–7800)
Neutrophils Relative %: 49.4 %
Platelets: 199 10*3/uL (ref 140–400)
RBC: 4 10*6/uL — ABNORMAL LOW (ref 4.20–5.80)
RDW: 13.3 % (ref 11.0–15.0)
Total Lymphocyte: 34 %
WBC: 5.6 10*3/uL (ref 3.8–10.8)

## 2019-12-23 LAB — HEMOGLOBIN A1C
Hgb A1c MFr Bld: 7.4 % of total Hgb — ABNORMAL HIGH (ref <5.7)
Mean Plasma Glucose: 166 (calc)
eAG (mmol/L): 9.2 (calc)

## 2019-12-23 LAB — MICROALBUMIN / CREATININE URINE RATIO
Creatinine, Urine: 226 mg/dL (ref 20–320)
Microalb Creat Ratio: 117 mcg/mg creat — ABNORMAL HIGH (ref <30)
Microalb, Ur: 26.4 mg/dL

## 2019-12-23 LAB — LIPID PANEL
Cholesterol: 129 mg/dL (ref <200)
HDL: 56 mg/dL (ref >=40)
LDL Cholesterol (Calc): 58 mg/dL (calc)
Non-HDL Cholesterol (Calc): 73 mg/dL (calc) (ref <130)
Total CHOL/HDL Ratio: 2.3 (calc) (ref <5.0)
Triglycerides: 66 mg/dL (ref <150)

## 2019-12-23 LAB — HEPATITIS C ANTIBODY
Hepatitis C Ab: NONREACTIVE
SIGNAL TO CUT-OFF: 0.04 (ref <1.00)

## 2019-12-23 LAB — HIV ANTIBODY (ROUTINE TESTING W REFLEX): HIV 1&2 Ab, 4th Generation: NONREACTIVE

## 2020-01-27 DIAGNOSIS — M79642 Pain in left hand: Secondary | ICD-10-CM | POA: Insufficient documentation

## 2020-03-22 ENCOUNTER — Ambulatory Visit: Payer: Self-pay | Admitting: Adult Health

## 2020-03-22 DIAGNOSIS — Z0289 Encounter for other administrative examinations: Secondary | ICD-10-CM

## 2020-06-08 ENCOUNTER — Telehealth: Payer: Self-pay | Admitting: Orthopedic Surgery

## 2020-06-08 NOTE — Telephone Encounter (Signed)
Referral has been received from [facility], Dr Wende Crease, for problems of bilateral carpal tunnel and trigger fingersx3. Notes in Epic chart, and per patient, has seen orthopaedic hand specialist. Patient states he would like to have surgery. Please review and advise.

## 2020-06-09 NOTE — Telephone Encounter (Signed)
SEND TO HAND SPECIALIST

## 2020-06-09 NOTE — Telephone Encounter (Signed)
Called back to patient; notified. Voiced understanding. Faxed note to referring provider's office. Closed referral.

## 2020-07-11 ENCOUNTER — Ambulatory Visit (HOSPITAL_COMMUNITY)
Admission: EM | Admit: 2020-07-11 | Discharge: 2020-07-11 | Disposition: A | Discharge status: Home / Self Care | Payer: Self-pay | Attending: Emergency Medicine | Admitting: Emergency Medicine

## 2020-07-11 ENCOUNTER — Other Ambulatory Visit: Payer: Self-pay

## 2020-07-11 ENCOUNTER — Encounter (HOSPITAL_COMMUNITY): Payer: Self-pay

## 2020-07-11 DIAGNOSIS — R197 Diarrhea, unspecified: Secondary | ICD-10-CM | POA: Insufficient documentation

## 2020-07-11 DIAGNOSIS — R112 Nausea with vomiting, unspecified: Secondary | ICD-10-CM | POA: Insufficient documentation

## 2020-07-11 LAB — CBC WITH DIFFERENTIAL/PLATELET
Abs Immature Granulocytes: 0.02 10*3/uL (ref 0.00–0.07)
Basophils Absolute: 0 10*3/uL (ref 0.0–0.1)
Basophils Relative: 1 %
Eosinophils Absolute: 0.1 10*3/uL (ref 0.0–0.5)
Eosinophils Relative: 1 %
HCT: 41.1 % (ref 39.0–52.0)
Hemoglobin: 14.8 g/dL (ref 13.0–17.0)
Immature Granulocytes: 0 %
Lymphocytes Relative: 40 %
Lymphs Abs: 2 10*3/uL (ref 0.7–4.0)
MCH: 33.8 pg (ref 26.0–34.0)
MCHC: 36 g/dL (ref 30.0–36.0)
MCV: 93.8 fL (ref 80.0–100.0)
Monocytes Absolute: 0.6 10*3/uL (ref 0.1–1.0)
Monocytes Relative: 11 %
Neutro Abs: 2.3 10*3/uL (ref 1.7–7.7)
Neutrophils Relative %: 47 %
Platelets: 207 10*3/uL (ref 150–400)
RBC: 4.38 MIL/uL (ref 4.22–5.81)
RDW: 13.4 % (ref 11.5–15.5)
WBC: 5 10*3/uL (ref 4.0–10.5)
nRBC: 0 % (ref 0.0–0.2)

## 2020-07-11 LAB — COMPREHENSIVE METABOLIC PANEL
ALT: 26 U/L (ref 0–44)
AST: 29 U/L (ref 15–41)
Albumin: 4.4 g/dL (ref 3.5–5.0)
Alkaline Phosphatase: 97 U/L (ref 38–126)
Anion gap: 9 (ref 5–15)
BUN: 12 mg/dL (ref 6–20)
CO2: 33 mmol/L — ABNORMAL HIGH (ref 22–32)
Calcium: 10 mg/dL (ref 8.9–10.3)
Chloride: 98 mmol/L (ref 98–111)
Creatinine, Ser: 1.37 mg/dL — ABNORMAL HIGH (ref 0.61–1.24)
GFR, Estimated: >60 mL/min (ref >60)
Glucose, Bld: 63 mg/dL — ABNORMAL LOW (ref 70–99)
Potassium: 4 mmol/L (ref 3.5–5.1)
Sodium: 140 mmol/L (ref 135–145)
Total Bilirubin: 1.2 mg/dL (ref 0.3–1.2)
Total Protein: 8 g/dL (ref 6.5–8.1)

## 2020-07-11 LAB — POCT URINALYSIS DIPSTICK, ED / UC
Bilirubin Urine: NEGATIVE
Glucose, UA: >=1000 mg/dL — AB
Hgb urine dipstick: NEGATIVE
Ketones, ur: NEGATIVE mg/dL
Leukocytes,Ua: NEGATIVE
Nitrite: NEGATIVE
Protein, ur: 30 mg/dL — AB
Specific Gravity, Urine: 1.015 (ref 1.005–1.030)
Urobilinogen, UA: 1 mg/dL (ref 0.0–1.0)
pH: 5.5 (ref 5.0–8.0)

## 2020-07-11 LAB — CBG MONITORING, ED: Glucose-Capillary: 197 mg/dL — ABNORMAL HIGH (ref 70–99)

## 2020-07-11 MED ORDER — ONDANSETRON HCL 4 MG PO TABS: 4.0000 mg | ORAL_TABLET | Freq: Three times a day (TID) | ORAL | 0 refills | Status: DC | PRN

## 2020-07-11 MED ORDER — ONDANSETRON 4 MG PO TBDP
ORAL_TABLET | ORAL | Status: AC
  Filled 2020-07-11: qty 1

## 2020-07-11 MED ORDER — ONDANSETRON 4 MG PO TBDP
4.0000 mg | ORAL_TABLET | Freq: Once | ORAL | Status: AC
  Administered 2020-07-11: 4 mg via ORAL

## 2020-07-11 NOTE — Discharge Instructions (Addendum)
Your exam is reassuring.  There is a lot of glucose to your urine which is new for you, so I have opted to do a few labs as well, we will call you if there are any concerns with these.  Monitor your blood sugar closely while you are ill.  Zofran every 8 hours as needed for nausea or vomiting.   Small frequent sips of fluids- Pedialyte, Gatorade, water, broth- to maintain hydration.   If any worsening of symptoms- uncontrolled blood sugar, dizziness, pain fevers or dehydration please go to the ER.

## 2020-07-11 NOTE — ED Triage Notes (Signed)
Pt reports nausea, vomiting and diarrhea after eating shrimp fried rice and chicken last night.
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
## 2020-11-09 ENCOUNTER — Encounter (HOSPITAL_COMMUNITY): Payer: Self-pay

## 2020-11-09 ENCOUNTER — Emergency Department (HOSPITAL_COMMUNITY)
Admission: EM | Admit: 2020-11-09 | Discharge: 2020-11-09 | Disposition: A | Discharge status: Home / Self Care | Payer: Self-pay | Attending: Emergency Medicine | Admitting: Emergency Medicine

## 2020-11-09 ENCOUNTER — Emergency Department (HOSPITAL_COMMUNITY): Payer: Self-pay

## 2020-11-09 DIAGNOSIS — H6121 Impacted cerumen, right ear: Secondary | ICD-10-CM | POA: Insufficient documentation

## 2020-11-09 DIAGNOSIS — W109XXA Fall (on) (from) unspecified stairs and steps, initial encounter: Secondary | ICD-10-CM | POA: Insufficient documentation

## 2020-11-09 DIAGNOSIS — Y92009 Unspecified place in unspecified non-institutional (private) residence as the place of occurrence of the external cause: Secondary | ICD-10-CM | POA: Insufficient documentation

## 2020-11-09 DIAGNOSIS — H9201 Otalgia, right ear: Secondary | ICD-10-CM | POA: Insufficient documentation

## 2020-11-09 DIAGNOSIS — Y9301 Activity, walking, marching and hiking: Secondary | ICD-10-CM | POA: Insufficient documentation

## 2020-11-09 DIAGNOSIS — Z794 Long term (current) use of insulin: Secondary | ICD-10-CM | POA: Insufficient documentation

## 2020-11-09 DIAGNOSIS — Z87891 Personal history of nicotine dependence: Secondary | ICD-10-CM | POA: Insufficient documentation

## 2020-11-09 DIAGNOSIS — R202 Paresthesia of skin: Secondary | ICD-10-CM | POA: Insufficient documentation

## 2020-11-09 DIAGNOSIS — Z7984 Long term (current) use of oral hypoglycemic drugs: Secondary | ICD-10-CM | POA: Insufficient documentation

## 2020-11-09 DIAGNOSIS — M25562 Pain in left knee: Secondary | ICD-10-CM | POA: Insufficient documentation

## 2020-11-09 DIAGNOSIS — E1040 Type 1 diabetes mellitus with diabetic neuropathy, unspecified: Secondary | ICD-10-CM | POA: Insufficient documentation

## 2020-11-09 MED ORDER — KETOROLAC TROMETHAMINE 30 MG/ML IJ SOLN
30.0000 mg | Freq: Once | INTRAMUSCULAR | Status: AC
  Administered 2020-11-09: 30 mg via INTRAMUSCULAR
  Filled 2020-11-09: qty 1

## 2020-11-09 NOTE — ED Triage Notes (Signed)
Pt arrived via POV, c/o q tip stuck in ear x2 weeks. Also c/o ,mechanical fall a couple days ago and left sided knee pain.

## 2020-11-09 NOTE — Discharge Instructions (Addendum)
You were seen today at [facility] emergency department for left knee pain and right ear pain.  We obtained imaging of your left knee which showed no abnormalities.  There is no joint effusion, fracture, infection.  You have been using a brace at home which you should continue to do as long as you are having knee pain.  Continue to use ibuprofen as directed on the bottle for pain management.  It is also reasonable for you to elevate the left leg and use ice to promote healing. We looked into your right ear which did not show any Q-tip, cotton, or other foreign body.  There is a small amount of cerumen/wax in your ear which may be causing some of your symptoms.  There is no sign of infection of the right ear or rupture of your tympanic membrane. If needed you can follow-up with [facility] for your knee pain in [facility] if you continue to have pain in your right ear. You may also return to [facility] emergency department or any emergency department if you are unable to walk on your left leg, lose sensation to the left leg, have increased swelling, redness, warmth, fevers.
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
## 2021-01-07 ENCOUNTER — Emergency Department (HOSPITAL_COMMUNITY): Payer: Medicaid Other

## 2021-01-07 ENCOUNTER — Other Ambulatory Visit: Payer: Self-pay

## 2021-01-07 ENCOUNTER — Emergency Department (HOSPITAL_COMMUNITY)
Admission: EM | Admit: 2021-01-07 | Discharge: 2021-01-07 | Disposition: A | Discharge status: Home / Self Care | Payer: Medicaid Other | Attending: Emergency Medicine | Admitting: Emergency Medicine

## 2021-01-07 DIAGNOSIS — E119 Type 2 diabetes mellitus without complications: Secondary | ICD-10-CM | POA: Insufficient documentation

## 2021-01-07 DIAGNOSIS — S61412A Laceration without foreign body of left hand, initial encounter: Secondary | ICD-10-CM | POA: Insufficient documentation

## 2021-01-07 DIAGNOSIS — Z7984 Long term (current) use of oral hypoglycemic drugs: Secondary | ICD-10-CM | POA: Insufficient documentation

## 2021-01-07 DIAGNOSIS — W19XXXA Unspecified fall, initial encounter: Secondary | ICD-10-CM

## 2021-01-07 DIAGNOSIS — W010XXA Fall on same level from slipping, tripping and stumbling without subsequent striking against object, initial encounter: Secondary | ICD-10-CM | POA: Insufficient documentation

## 2021-01-07 DIAGNOSIS — Z87891 Personal history of nicotine dependence: Secondary | ICD-10-CM | POA: Insufficient documentation

## 2021-01-07 DIAGNOSIS — Z794 Long term (current) use of insulin: Secondary | ICD-10-CM | POA: Insufficient documentation

## 2021-01-07 MED ORDER — LIDOCAINE HCL (PF) 1 % IJ SOLN
5.0000 mL | Freq: Once | INTRAMUSCULAR | Status: DC
  Filled 2021-01-07: qty 30

## 2021-01-07 NOTE — Discharge Instructions (Addendum)
Please have your stitches removed in 10 days.  Please apply triple antibiotic to the cut 1-2 times per day.  I have attached information on how to manage your cut. Please reference this with any questions.  If you develop any worsening symptoms please come back to the emergency department.  It was a pleasure to meet you.
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
## 2021-01-07 NOTE — ED Triage Notes (Signed)
States he tripped and tried to catch himself with his L arm, his L hand went through a glass table, laceration noted to L hand area.

## 2021-10-04 ENCOUNTER — Encounter (HOSPITAL_COMMUNITY): Payer: Self-pay

## 2021-10-04 ENCOUNTER — Emergency Department (HOSPITAL_COMMUNITY): Payer: Self-pay

## 2021-10-04 ENCOUNTER — Emergency Department (HOSPITAL_COMMUNITY)
Admission: EM | Admit: 2021-10-04 | Discharge: 2021-10-04 | Disposition: A | Discharge status: Home / Self Care | Payer: Self-pay | Attending: Emergency Medicine | Admitting: Emergency Medicine

## 2021-10-04 DIAGNOSIS — R202 Paresthesia of skin: Secondary | ICD-10-CM | POA: Insufficient documentation

## 2021-10-04 DIAGNOSIS — Z794 Long term (current) use of insulin: Secondary | ICD-10-CM | POA: Insufficient documentation

## 2021-10-04 DIAGNOSIS — M79641 Pain in right hand: Secondary | ICD-10-CM

## 2021-10-04 DIAGNOSIS — E119 Type 2 diabetes mellitus without complications: Secondary | ICD-10-CM | POA: Insufficient documentation

## 2021-10-04 DIAGNOSIS — Z7984 Long term (current) use of oral hypoglycemic drugs: Secondary | ICD-10-CM | POA: Insufficient documentation

## 2021-10-04 NOTE — ED Triage Notes (Signed)
Pt arrived via POV, c/o right hand and wrist pain.

## 2021-10-04 NOTE — Discharge Instructions (Signed)
Your x rays were normal and did not show any underlying fracture. I recommend contacting your orthopedic surgeon to for reevaluation since your symptoms have worsened after your surgery.
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
## 2021-11-13 ENCOUNTER — Emergency Department (HOSPITAL_COMMUNITY): Payer: Medicaid Other

## 2021-11-13 ENCOUNTER — Emergency Department (HOSPITAL_COMMUNITY)
Admission: EM | Admit: 2021-11-13 | Discharge: 2021-11-13 | Disposition: A | Discharge status: Home / Self Care | Payer: Medicaid Other | Attending: Emergency Medicine | Admitting: Emergency Medicine

## 2021-11-13 ENCOUNTER — Other Ambulatory Visit: Payer: Self-pay

## 2021-11-13 ENCOUNTER — Encounter (HOSPITAL_COMMUNITY): Payer: Self-pay

## 2021-11-13 DIAGNOSIS — S62355A Nondisplaced fracture of shaft of fourth metacarpal bone, left hand, initial encounter for closed fracture: Secondary | ICD-10-CM | POA: Insufficient documentation

## 2021-11-13 DIAGNOSIS — Z7984 Long term (current) use of oral hypoglycemic drugs: Secondary | ICD-10-CM | POA: Insufficient documentation

## 2021-11-13 DIAGNOSIS — Z794 Long term (current) use of insulin: Secondary | ICD-10-CM | POA: Insufficient documentation

## 2021-11-13 DIAGNOSIS — W228XXA Striking against or struck by other objects, initial encounter: Secondary | ICD-10-CM | POA: Insufficient documentation

## 2021-11-13 DIAGNOSIS — S62365B Nondisplaced fracture of neck of fourth metacarpal bone, left hand, initial encounter for open fracture: Secondary | ICD-10-CM

## 2021-11-13 DIAGNOSIS — E119 Type 2 diabetes mellitus without complications: Secondary | ICD-10-CM | POA: Insufficient documentation

## 2021-11-13 MED ORDER — AMOXICILLIN-POT CLAVULANATE 875-125 MG PO TABS: 1.0000 | ORAL_TABLET | Freq: Two times a day (BID) | ORAL | 0 refills | Status: AC
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
## 2021-11-13 NOTE — Discharge Instructions (Addendum)
You have been seen today for your complaint of left hand pain. Your imaging showed fracture of your left fourth finger. Your discharge medications include Augmentin.  This is an antibiotic.  You should take it as prescribed.  You should take it for the entire duration of the prescription.  This medication may cause upset stomach.  You may take it with food.  This is normal.. Home care instructions are as follows:  You may ice the affected hand through the splint for 15 minutes at a time.  You should keep the hand elevated above your heart when resting. Follow up with: Orthopedics soon as possible.  You should call the number listed on this packet today. Please seek immediate medical care if you develop any of the following symptoms: You have very bad pain under the cast or in your hand. The following happens, even after you loosen your splint: Your hand or fingernails turn blue or gray. Your hand feels cold or numb. At this time there does not appear to be the presence of an emergent medical condition, however there is always the potential for conditions to change. Please read and follow the below instructions.  Do not take your medicine if  develop an itchy rash, swelling in your mouth or lips, or difficulty breathing; call 911 and seek immediate emergency medical attention if this occurs.  You may review your lab tests and imaging results in their entirety on your MyChart account.  Please discuss all results of fully with your primary care provider and other specialist at your follow-up visit.  Note: Portions of this text may have been transcribed using voice recognition software. Every effort was made to ensure accuracy; however, inadvertent computerized transcription errors may still be present.
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
## 2021-11-17 ENCOUNTER — Ambulatory Visit: Payer: Self-pay | Admitting: Orthopedic Surgery

## 2021-11-17 ENCOUNTER — Telehealth: Payer: Self-pay | Admitting: Orthopedic Surgery

## 2021-11-17 NOTE — Telephone Encounter (Signed)
Patient had left a message yesterday afternoon 11/16/21 requesting to cancel and reschedule appointment with Dr Romeo Apple for"Open Fx LT Hand/Wound Check/this day per Dr Romeo Apple" due to transportation. We have been calling him back multiple times to reschedule, and he has no voice mail set up. Patient just called now at 12:15pm; offered first available,11/22/21, since office is closed Monday for holiday, and Dr Romeo Apple is in surgery Tuesday. Patient aware if any concerns prior to the rescheduled office visit, to return to emergency room or urgent care.

## 2021-11-22 ENCOUNTER — Encounter: Payer: Self-pay | Admitting: Orthopedic Surgery

## 2021-11-22 ENCOUNTER — Ambulatory Visit (INDEPENDENT_AMBULATORY_CARE_PROVIDER_SITE_OTHER): Payer: Self-pay | Admitting: Orthopedic Surgery

## 2021-11-22 ENCOUNTER — Ambulatory Visit: Payer: Self-pay | Admitting: Orthopedic Surgery

## 2021-11-22 VITALS — BP 143/94 | HR 90 | Ht 73.0 in | Wt 172.0 lb

## 2021-11-22 DIAGNOSIS — S62365A Nondisplaced fracture of neck of fourth metacarpal bone, left hand, initial encounter for closed fracture: Secondary | ICD-10-CM
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
## 2021-12-06 ENCOUNTER — Ambulatory Visit (INDEPENDENT_AMBULATORY_CARE_PROVIDER_SITE_OTHER): Payer: Self-pay | Admitting: Orthopedic Surgery

## 2021-12-06 ENCOUNTER — Ambulatory Visit (INDEPENDENT_AMBULATORY_CARE_PROVIDER_SITE_OTHER): Payer: Self-pay

## 2021-12-06 DIAGNOSIS — S62365D Nondisplaced fracture of neck of fourth metacarpal bone, left hand, subsequent encounter for fracture with routine healing: Secondary | ICD-10-CM

## 2021-12-06 DIAGNOSIS — S62365A Nondisplaced fracture of neck of fourth metacarpal bone, left hand, initial encounter for closed fracture: Secondary | ICD-10-CM | POA: Insufficient documentation

## 2021-12-06 NOTE — Progress Notes (Signed)
Chief Complaint  Patient presents with   Post-op Follow-up    left Hand 11/13/21   Encounter Diagnosis  Name Primary?   Closed nondisplaced fracture of neck of fourth metacarpal bone of left hand with routine healing, subsequent encounter 11/11/21 Yes   41 year old male closed fracture of neck of the fourth metacarpal on his left hand he is in a metacarpal brace doing well with that he likes it he says it holds the fracture steady so he can move his hand  He has no signs of infection  His x-ray looks good  We will rex-ray in 3 weeks which will be the 6-week point since the injury

## 2021-12-12 IMAGING — CR DG KNEE COMPLETE 4+V*L*
4 series · 4 of 4 positions shown · non-contrast
Comparison: None.

CLINICAL DATA: Left knee pain after fall several days ago.

EXAM:
LEFT KNEE - COMPLETE 4+ VIEW

[t knee obl left (1 of 2)]
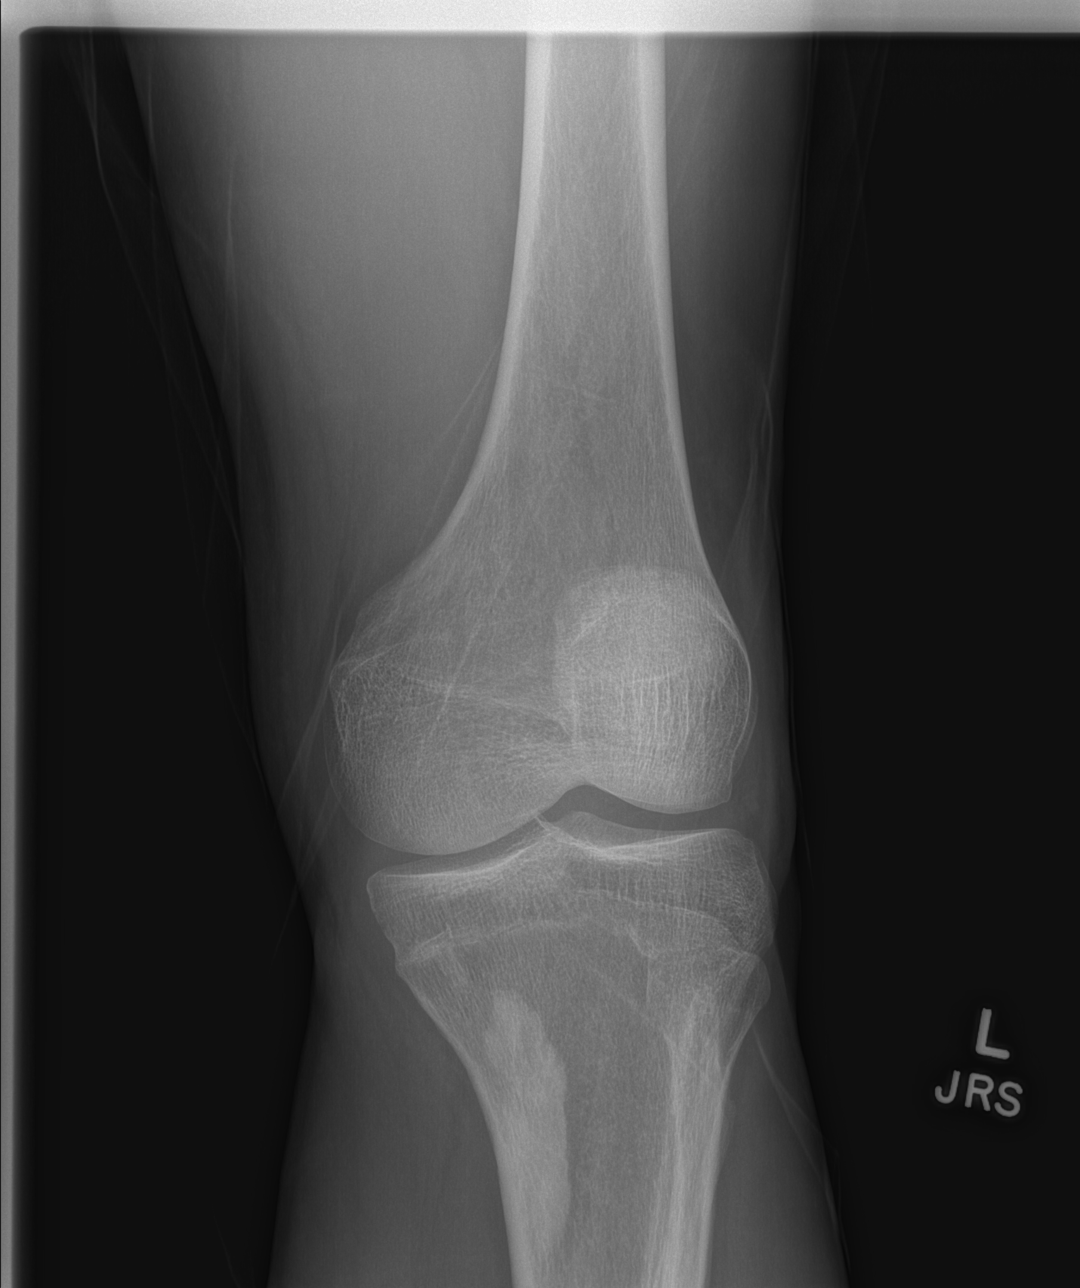

[t knee ap left]
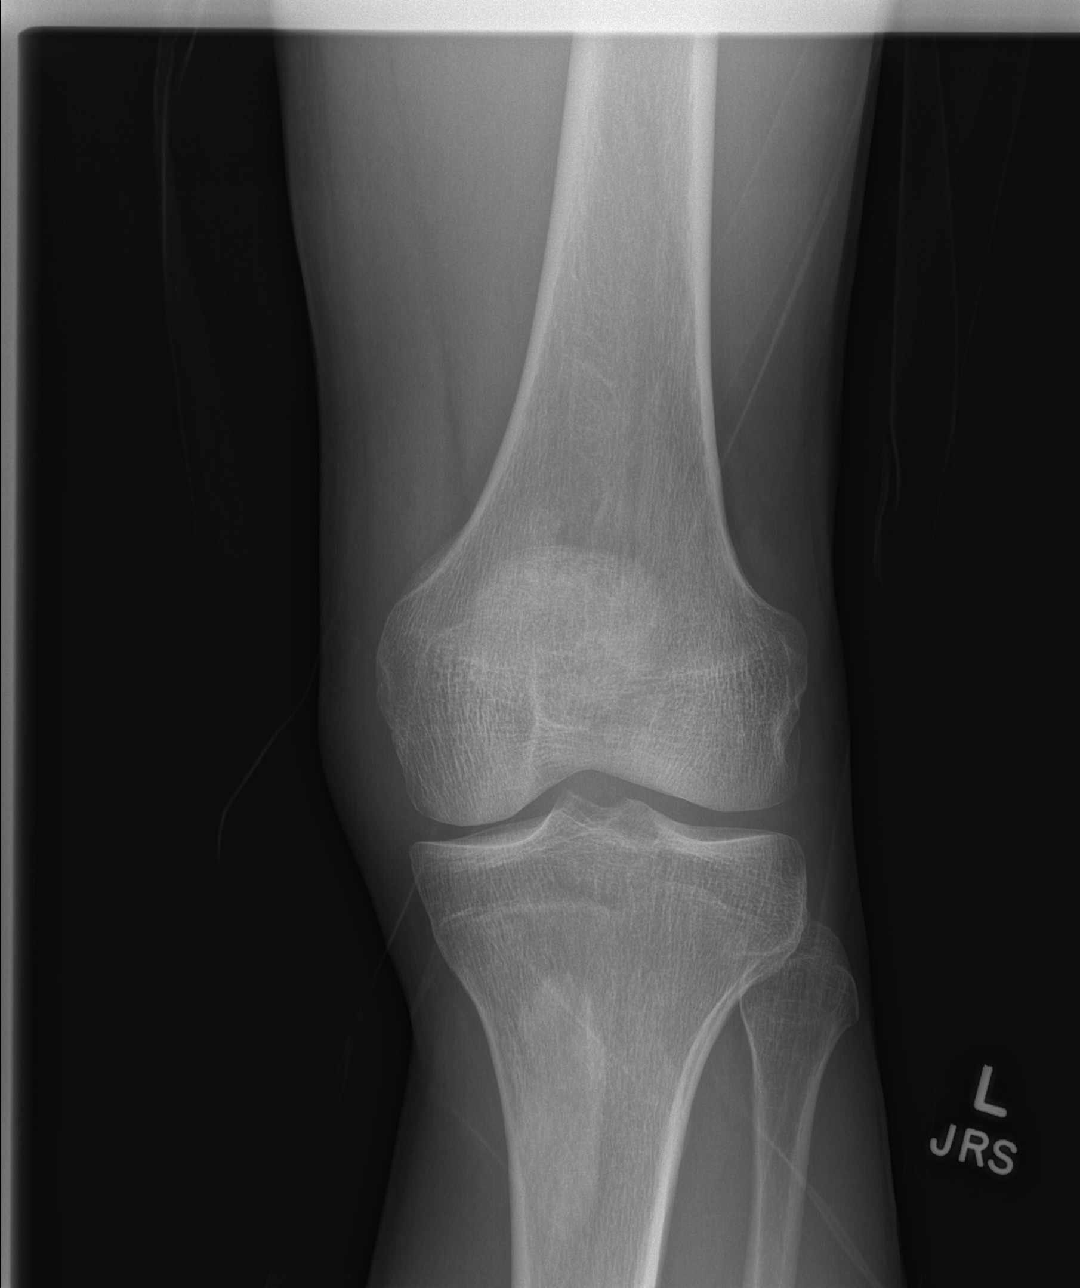

[t knee obl left (2 of 2)]
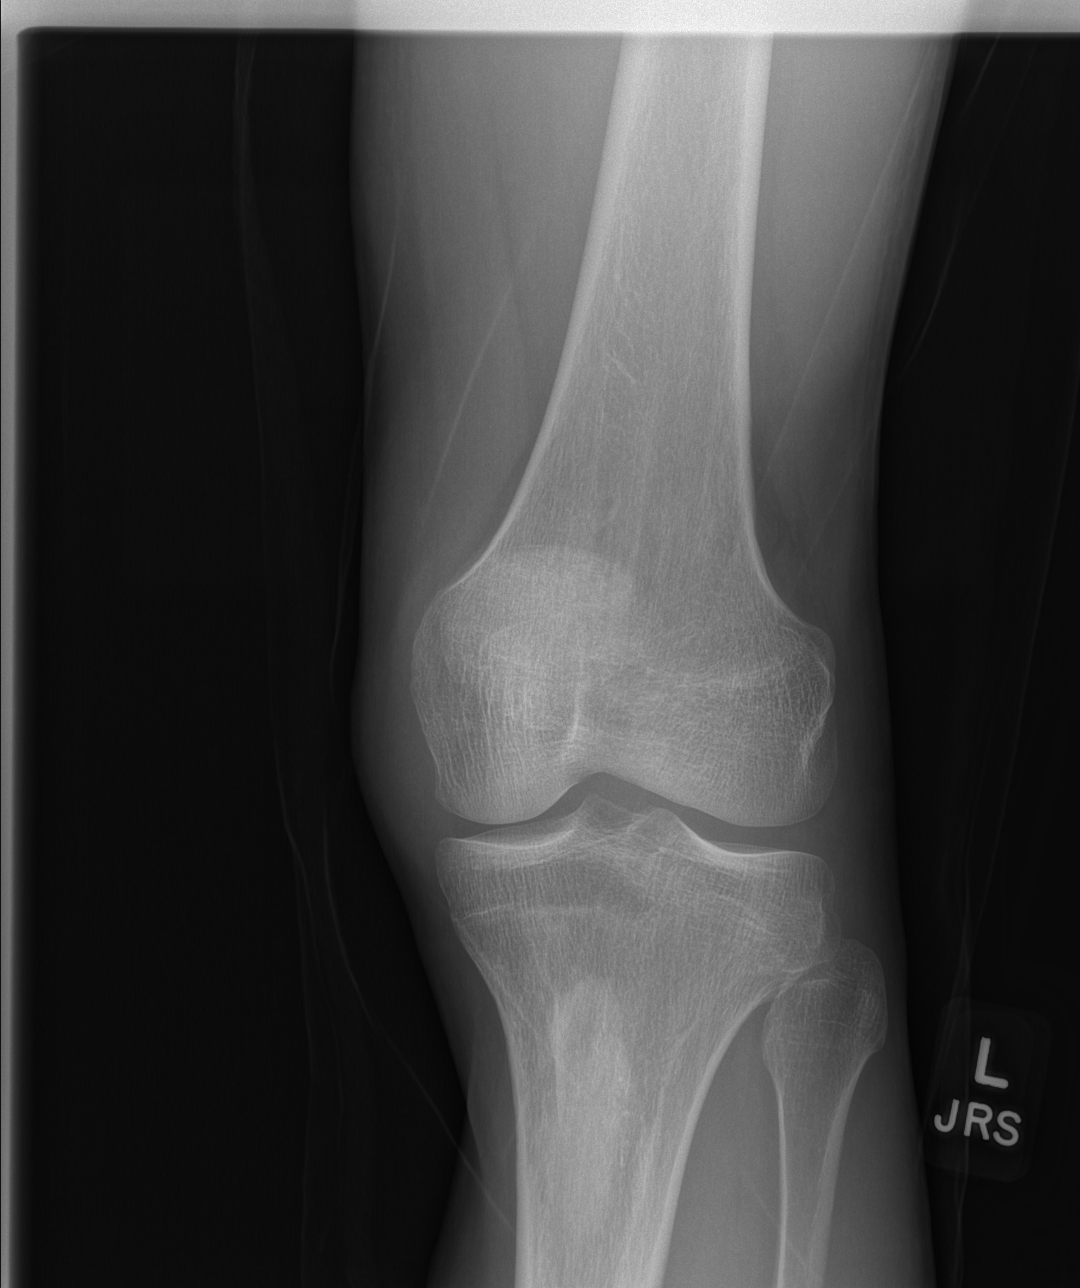

[t knee lat left]
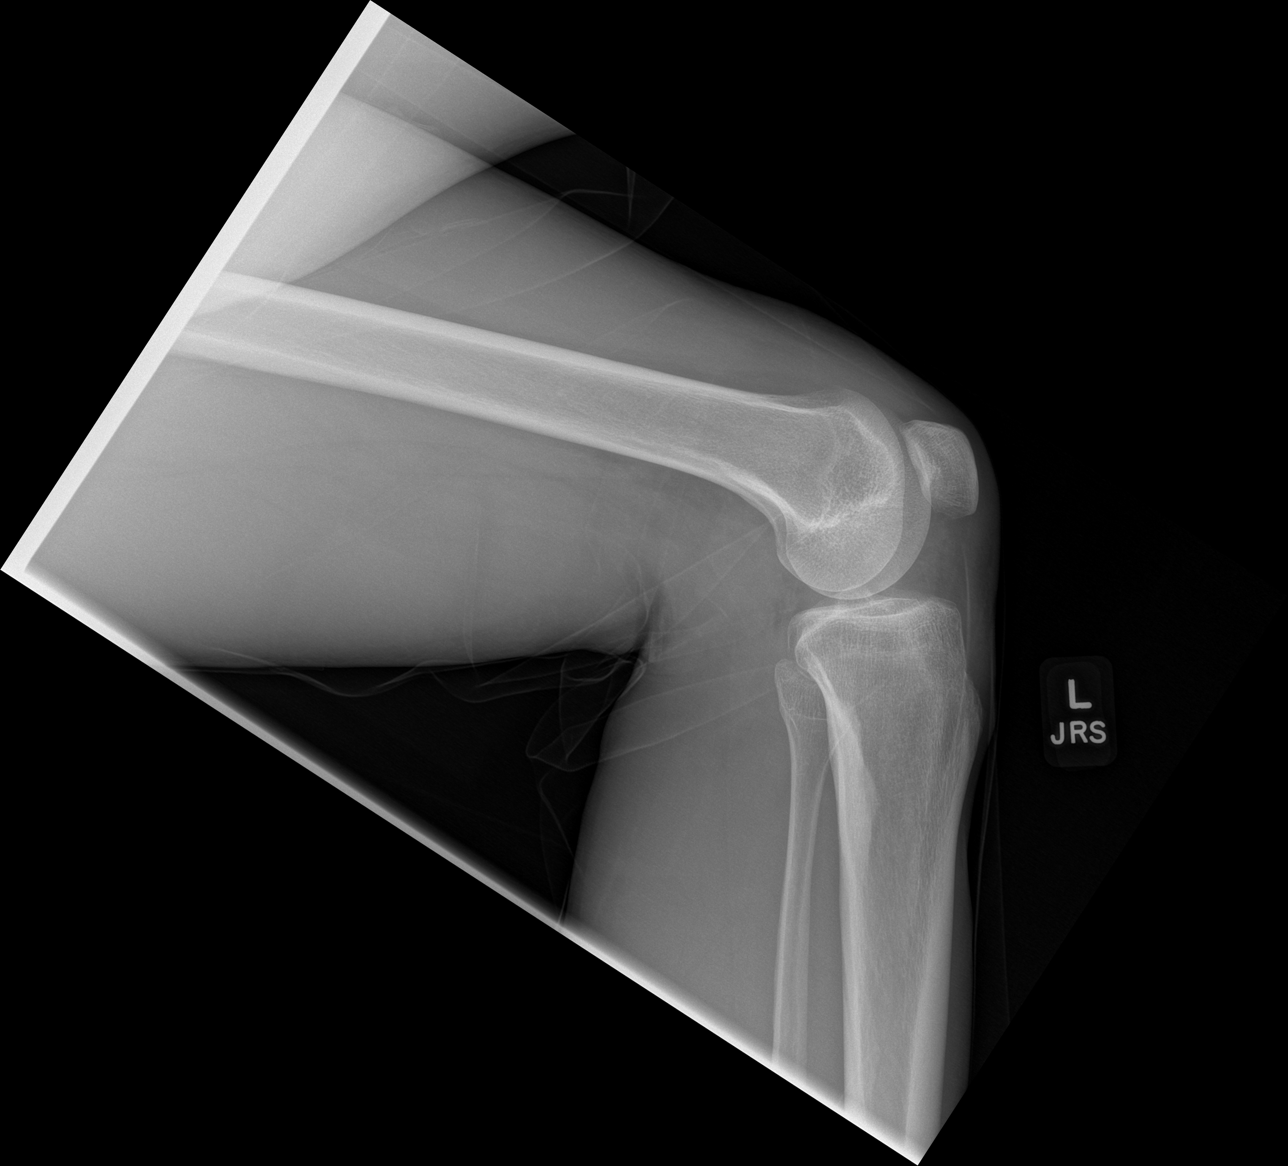

[4 of 4 positions shown; findings below may reference images not displayed]

FINDINGS: No evidence of fracture, dislocation, or joint effusion. No evidence
of arthropathy or other focal bone abnormality. Soft tissues are
unremarkable.
IMPRESSION: Negative.

## 2021-12-27 ENCOUNTER — Encounter: Payer: Self-pay | Admitting: Orthopedic Surgery

## 2021-12-27 ENCOUNTER — Ambulatory Visit (INDEPENDENT_AMBULATORY_CARE_PROVIDER_SITE_OTHER): Payer: Self-pay

## 2021-12-27 ENCOUNTER — Ambulatory Visit (INDEPENDENT_AMBULATORY_CARE_PROVIDER_SITE_OTHER): Payer: Self-pay | Admitting: Orthopedic Surgery

## 2021-12-27 DIAGNOSIS — S62365D Nondisplaced fracture of neck of fourth metacarpal bone, left hand, subsequent encounter for fracture with routine healing: Secondary | ICD-10-CM

## 2021-12-27 NOTE — Progress Notes (Signed)
Fracture care follow-up Chief Complaint  Patient presents with   Hand Injury    Improving 11/11/21 date of injury     Encounter Diagnosis  Name Primary?   Closed nondisplaced fracture of neck of fourth metacarpal bone of left hand with routine healing, subsequent encounter Yes    Patient has full range of motion of his right hand  His x-ray looks good  He has some swelling in the area of the fracture but no tenderness at the fracture site  I released him

## 2022-01-17 ENCOUNTER — Other Ambulatory Visit: Payer: Self-pay

## 2022-01-17 ENCOUNTER — Emergency Department (HOSPITAL_COMMUNITY)
Admission: EM | Admit: 2022-01-17 | Discharge: 2022-01-17 | Disposition: A | Discharge status: Home / Self Care | Payer: Medicaid Other | Attending: Emergency Medicine | Admitting: Emergency Medicine

## 2022-01-17 ENCOUNTER — Encounter (HOSPITAL_COMMUNITY): Payer: Self-pay

## 2022-01-17 DIAGNOSIS — H6691 Otitis media, unspecified, right ear: Secondary | ICD-10-CM | POA: Insufficient documentation

## 2022-01-17 DIAGNOSIS — J020 Streptococcal pharyngitis: Secondary | ICD-10-CM | POA: Insufficient documentation

## 2022-01-17 DIAGNOSIS — Z20822 Contact with and (suspected) exposure to covid-19: Secondary | ICD-10-CM | POA: Insufficient documentation

## 2022-01-17 DIAGNOSIS — H6690 Otitis media, unspecified, unspecified ear: Secondary | ICD-10-CM

## 2022-01-17 LAB — RESP PANEL BY RT-PCR (FLU A&B, COVID) ARPGX2
Influenza A by PCR: NEGATIVE
Influenza B by PCR: NEGATIVE
SARS Coronavirus 2 by RT PCR: NEGATIVE

## 2022-01-17 LAB — GROUP A STREP BY PCR: Group A Strep by PCR: DETECTED — AB

## 2022-01-17 MED ORDER — ACETAMINOPHEN 325 MG PO TABS
650.0000 mg | ORAL_TABLET | Freq: Once | ORAL | Status: AC
Start: 2022-01-17 — End: 2022-01-17
  Administered 2022-01-17: 650 mg via ORAL
  Filled 2022-01-17: qty 2

## 2022-01-17 MED ORDER — AMOXICILLIN-POT CLAVULANATE 875-125 MG PO TABS
1.0000 | ORAL_TABLET | Freq: Once | ORAL | Status: AC
  Administered 2022-01-17: 1 via ORAL
  Filled 2022-01-17: qty 1

## 2022-01-17 MED ORDER — AMOXICILLIN-POT CLAVULANATE 875-125 MG PO TABS: 1.0000 | ORAL_TABLET | Freq: Two times a day (BID) | ORAL | 0 refills | Status: DC

## 2022-01-17 NOTE — Discharge Instructions (Signed)
You were diagnosed today with strep throat and otitis media, an infection of the middle ear.  Please take the prescribed antibiotic.  You may take Tylenol or ibuprofen as needed for pain.

## 2022-01-17 NOTE — ED Provider Triage Note (Signed)
Emergency Medicine Provider Triage Evaluation Note  Jeremy Nguyen , a 41 y.o. male  was evaluated in triage.  Pt complains of sore throat.  Patient states that symptoms have been ongoing for about 3 days now.  He also endorses headache.  Denies any cough or fevers.  States that it hurts to swallow.  Tolerates liquid..  Review of Systems  Positive:  Negative: See above  Physical Exam  BP (!) 151/87 (BP Location: Left Arm)   Pulse 80   Temp 99.4 F (37.4 C) (Oral)   Resp 18   SpO2 100%  Gen:   Awake, no distress   Resp:  Normal effort  MSK:   Moves extremities without difficulty  Other:  There is tonsillar swelling and exudate on the right side.  No obvious PTA.  He does have uvular swelling.  No stridor or trismus or airway compromise.  Medical Decision Making  Medically screening exam initiated at 3:55 PM.  Appropriate orders placed.  RALIEGH SCOBIE was informed that the remainder of the evaluation will be completed by another provider, this initial triage assessment does not replace that evaluation, and the importance of remaining in the ED until their evaluation is complete.     [name], PA-C 01/17/22 1556

## 2022-01-17 NOTE — ED Triage Notes (Signed)
Patient said it burns to swallow. Has been going on for 2 days. Has a right ear ache.

## 2022-01-17 NOTE — ED Provider Notes (Signed)
[facility] DEPT Provider Note   CSN: [id number] Arrival date & time: 01/17/22  1513     History  Chief Complaint  Patient presents with   Sore Throat    Jeremy Nguyen is a 41 y.o. male.  Patient presents to the emergency department complaining of a sore throat.  Patient states that his symptoms have been ongoing for approximately 3 days.  He also endorses right-sided ear pain.  He denies cough, fevers, nausea at this time.  Patient states it hurts to swallow.  He is tolerating oral liquid intake.  Past medical history significant for type I DM, previous cardiac arrest, depression  HPI     Home Medications Prior to Admission medications   Medication Sig Start Date End Date Taking? Authorizing Provider  amoxicillin-clavulanate (AUGMENTIN) 875-125 MG tablet Take 1 tablet by mouth every 12 (twelve) hours. 01/17/22  Yes Dorothyann Peng, PA-C  cholecalciferol (VITAMIN D3) 25 MCG (1000 UNIT) tablet Take 3,000 Units by mouth daily.    [provider]  glipiZIDE (GLUCOTROL) 10 MG tablet Take 10 mg by mouth daily before breakfast.    [provider]  glucose blood (ACCU-CHEK GUIDE) test strip Use to test blood sugar 4 times daily 04/23/18   Elayne Snare, MD  insulin aspart (NOVOLOG) 100 UNIT/ML injection Inject 3-5 Units into the skin 3 (three) times daily before meals. 04/23/18   Elayne Snare, MD  insulin glargine (LANTUS) 100 UNIT/ML injection Inject into the skin. 01/12/11   [provider]  lisinopril (ZESTRIL) 5 MG tablet Take 1 tablet by mouth daily.    [provider]  loratadine (CLARITIN) 10 MG tablet Take 1 tablet (10 mg total) by mouth daily. 04/25/15   Charlann Lange, PA-C  ondansetron (ZOFRAN) 4 MG tablet Take 1 tablet (4 mg total) by mouth every 8 (eight) hours as needed for nausea or vomiting. 07/11/20   Augusto Gamble B, NP  pioglitazone (ACTOS) 45 MG tablet Take 45 mg by mouth daily.    [provider]       Allergies    Aspirin and Ativan [lorazepam]    Review of Systems   Review of Systems  Constitutional:  Negative for fever.  HENT:  Positive for ear pain and sore throat. Negative for ear discharge.   Gastrointestinal:  Negative for abdominal pain, nausea and vomiting.    Physical Exam Updated Vital Signs BP (!) 151/87 (BP Location: Left Arm)   Pulse 80   Temp 99.4 F (37.4 C) (Oral)   Resp 18   SpO2 100%  Physical Exam Vitals and nursing note reviewed.  Constitutional:      General: He is not in acute distress.    Appearance: He is well-developed.  HENT:     Head: Normocephalic and atraumatic.     Right Ear: A middle ear effusion is present. Tympanic membrane is erythematous.     Left Ear: Tympanic membrane normal.     Mouth/Throat:     Mouth: Mucous membranes are moist.     Pharynx: Uvula midline. Oropharyngeal exudate, posterior oropharyngeal erythema and uvula swelling present.     Tonsils: Tonsillar exudate present. No tonsillar abscesses.  Eyes:     Conjunctiva/sclera: Conjunctivae normal.  Cardiovascular:     Rate and Rhythm: Normal rate and regular rhythm.     Heart sounds: No murmur heard. Pulmonary:     Effort: Pulmonary effort is normal. No respiratory distress.     Breath sounds: Normal breath sounds.  Abdominal:     Palpations: Abdomen is soft.     Tenderness: There is no abdominal tenderness.  Musculoskeletal:        General: No swelling.     Cervical back: Neck supple.  Skin:    General: Skin is warm and dry.     Capillary Refill: Capillary refill takes less than 2 seconds.  Neurological:     Mental Status: He is alert.  Psychiatric:        Mood and Affect: Mood normal.     ED Results / Procedures / Treatments   Labs (all labs ordered are listed, but only abnormal results are displayed) Labs Reviewed  GROUP A STREP BY PCR - Abnormal; Notable for the following components:      Result Value   Group A Strep by PCR DETECTED (*)    All other  components within normal limits  RESP PANEL BY RT-PCR (FLU A&B, COVID) [id number]    EKG None  Radiology No results found.  Procedures Procedures    Medications Ordered in ED Medications  amoxicillin-clavulanate (AUGMENTIN) 875-125 MG per tablet 1 tablet (has no administration in time range)  acetaminophen (TYLENOL) tablet 650 mg (650 mg Oral Given 01/17/22 1603)    ED Course/ Medical Decision Making/ A&P                           Medical Decision Making Risk Prescription drug management.   Patient presents to the emergency room complaining of sore throat and right-sided ear pain.  Differential diagnosis includes but is not limited to strep throat, other viral illness, otitis media, otitis externa, and others  I reviewed the patient's past medical history and found no relevant documents or visits  I ordered and reviewed labs including respiratory panel and group A strep PCR test.  Patient was positive for group A strep.  Negative for coronavirus or influenza A or B  There is no indication at this time for imaging  I ordered the patient Tylenol for low-grade fever and pain and Augmentin for antibiotic coverage.  Upon reassessment the patient was feeling somewhat better  There is indication at this time for admission.  The patient has strep pharyngitis and otitis media of the right ear.  Plan to discharge patient home.  Patient prescribed Augmentin to cover for both the strep throat and the otitis media.  Patient to use Tylenol and ibuprofen as needed for information and pain control at home.  Patient given return precautions including inability to swallow       Final Clinical Impression(s) / ED Diagnoses Final diagnoses:  Strep pharyngitis  Acute otitis media, unspecified otitis media type    Rx / DC Orders ED Discharge Orders          Ordered    amoxicillin-clavulanate (AUGMENTIN) 875-125 MG tablet  Every 12 hours        01/17/22 2004              Pamala Duffel 01/17/22 2010    Ernie Avena, MD 01/17/22 2246
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
## 2022-02-27 ENCOUNTER — Ambulatory Visit
Admission: EM | Admit: 2022-02-27 | Discharge: 2022-02-27 | Disposition: A | Discharge status: Home / Self Care | Payer: Medicaid Other | Attending: Emergency Medicine | Admitting: Emergency Medicine

## 2022-02-27 DIAGNOSIS — E1065 Type 1 diabetes mellitus with hyperglycemia: Secondary | ICD-10-CM | POA: Diagnosis not present

## 2022-02-27 LAB — POCT URINALYSIS DIP (MANUAL ENTRY)
Bilirubin, UA: NEGATIVE
Blood, UA: NEGATIVE
Glucose, UA: 500 mg/dL — AB
Ketones, POC UA: NEGATIVE mg/dL
Leukocytes, UA: NEGATIVE
Nitrite, UA: NEGATIVE
Protein Ur, POC: >=300 mg/dL — AB
Spec Grav, UA: >=1.03 — AB (ref 1.010–1.025)
Urobilinogen, UA: 1 E.U./dL
pH, UA: 5.5 (ref 5.0–8.0)

## 2022-02-27 LAB — POCT FASTING CBG KUC MANUAL ENTRY: POCT Glucose (KUC): 315 mg/dL — AB (ref 70–99)

## 2022-02-27 MED ORDER — RELION PREMIER TEST VI STRP: 1.0000 | ORAL_STRIP | Freq: Four times a day (QID) | 0 refills | Status: AC

## 2022-02-27 MED ORDER — INSULIN GLARGINE 100 UNIT/ML SOLOSTAR PEN: 12.0000 [IU] | PEN_INJECTOR | Freq: Two times a day (BID) | SUBCUTANEOUS | 1 refills | Status: AC

## 2022-02-27 MED ORDER — PEN NEEDLES 32G X 4 MM MISC: 1.0000 | Freq: Four times a day (QID) | 0 refills | Status: AC

## 2022-02-27 MED ORDER — NOVOLOG FLEXPEN 100 UNIT/ML ~~LOC~~ SOPN: 10.0000 [IU] | PEN_INJECTOR | Freq: Two times a day (BID) | SUBCUTANEOUS | 0 refills | Status: AC

## 2022-02-27 MED ORDER — RELION LANCETS ULTRA-THIN 30G MISC: 1.0000 | Freq: Four times a day (QID) | 0 refills | Status: AC

## 2022-02-27 MED ORDER — GLIPIZIDE 5 MG PO TABS: 5.0000 mg | ORAL_TABLET | Freq: Every day | ORAL | 0 refills | Status: AC

## 2022-02-27 MED ORDER — LISINOPRIL 5 MG PO TABS: 5.0000 mg | ORAL_TABLET | Freq: Every day | ORAL | 0 refills | Status: AC
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
## 2022-02-27 NOTE — Discharge Instructions (Addendum)
If your blood sugar reaches greater than 400 again, please go immediately to the emergency room for further evaluation.  Please do not come to urgent care as we are not equipped to handle patients with high blood sugar.  As a courtesy, I have renewed your prescriptions for glipizide, NovoLog, Lantus, lisinopril and pen needles.  Please take all exactly as prescribed.  There will be no further refills here at urgent care.  You must follow-up with a primary care provider for further refills.

## 2022-02-27 NOTE — ED Triage Notes (Addendum)
The patient states he is needing a refill on his insulin (novolog). The patient c/o fatigue and states he has not had novolog insulin in 24hrs. The patient states today around 2:30 pm his CBG was around 470 (provider is aware)and he took 28 units of Lantus. The patient is alert & oriented x4 and with family member.

## 2022-05-05 ENCOUNTER — Emergency Department (HOSPITAL_COMMUNITY): Payer: Medicaid Other

## 2022-05-05 ENCOUNTER — Other Ambulatory Visit: Payer: Self-pay

## 2022-05-05 ENCOUNTER — Encounter (HOSPITAL_COMMUNITY): Payer: Self-pay | Admitting: *Deleted

## 2022-05-05 ENCOUNTER — Emergency Department (HOSPITAL_COMMUNITY)
Admission: EM | Admit: 2022-05-05 | Discharge: 2022-05-05 | Disposition: A | Discharge status: Home / Self Care | Payer: Medicaid Other | Attending: Emergency Medicine | Admitting: Emergency Medicine

## 2022-05-05 DIAGNOSIS — Z794 Long term (current) use of insulin: Secondary | ICD-10-CM | POA: Insufficient documentation

## 2022-05-05 DIAGNOSIS — Z7984 Long term (current) use of oral hypoglycemic drugs: Secondary | ICD-10-CM | POA: Insufficient documentation

## 2022-05-05 DIAGNOSIS — T68XXXA Hypothermia, initial encounter: Secondary | ICD-10-CM | POA: Insufficient documentation

## 2022-05-05 DIAGNOSIS — F1410 Cocaine abuse, uncomplicated: Secondary | ICD-10-CM | POA: Insufficient documentation

## 2022-05-05 DIAGNOSIS — E162 Hypoglycemia, unspecified: Secondary | ICD-10-CM

## 2022-05-05 DIAGNOSIS — E10649 Type 1 diabetes mellitus with hypoglycemia without coma: Secondary | ICD-10-CM | POA: Insufficient documentation

## 2022-05-05 DIAGNOSIS — Z79899 Other long term (current) drug therapy: Secondary | ICD-10-CM | POA: Insufficient documentation

## 2022-05-05 DIAGNOSIS — X31XXXA Exposure to excessive natural cold, initial encounter: Secondary | ICD-10-CM | POA: Insufficient documentation

## 2022-05-05 LAB — COMPREHENSIVE METABOLIC PANEL
ALT: 18 U/L (ref 0–44)
AST: 24 U/L (ref 15–41)
Albumin: 3.7 g/dL (ref 3.5–5.0)
Alkaline Phosphatase: 82 U/L (ref 38–126)
Anion gap: 10 (ref 5–15)
BUN: 13 mg/dL (ref 6–20)
CO2: 26 mmol/L (ref 22–32)
Calcium: 9 mg/dL (ref 8.9–10.3)
Chloride: 101 mmol/L (ref 98–111)
Creatinine, Ser: 1.41 mg/dL — ABNORMAL HIGH (ref 0.61–1.24)
GFR, Estimated: >60 mL/min (ref >60)
Glucose, Bld: 81 mg/dL (ref 70–99)
Potassium: 3.1 mmol/L — ABNORMAL LOW (ref 3.5–5.1)
Sodium: 137 mmol/L (ref 135–145)
Total Bilirubin: 1.1 mg/dL (ref 0.3–1.2)
Total Protein: 7 g/dL (ref 6.5–8.1)

## 2022-05-05 LAB — RAPID URINE DRUG SCREEN, HOSP PERFORMED
Amphetamines: NOT DETECTED
Barbiturates: NOT DETECTED
Benzodiazepines: NOT DETECTED
Cocaine: POSITIVE — AB
Opiates: NOT DETECTED
Tetrahydrocannabinol: POSITIVE — AB

## 2022-05-05 LAB — URINALYSIS, ROUTINE W REFLEX MICROSCOPIC
Bacteria, UA: NONE SEEN
Bilirubin Urine: NEGATIVE
Glucose, UA: 150 mg/dL — AB
Hgb urine dipstick: NEGATIVE
Ketones, ur: NEGATIVE mg/dL
Leukocytes,Ua: NEGATIVE
Nitrite: NEGATIVE
Protein, ur: 30 mg/dL — AB
Specific Gravity, Urine: 1.008 (ref 1.005–1.030)
pH: 6 (ref 5.0–8.0)

## 2022-05-05 LAB — CBC WITH DIFFERENTIAL/PLATELET
Abs Immature Granulocytes: 0.03 10*3/uL (ref 0.00–0.07)
Basophils Absolute: 0 10*3/uL (ref 0.0–0.1)
Basophils Relative: 1 %
Eosinophils Absolute: 0.1 10*3/uL (ref 0.0–0.5)
Eosinophils Relative: 2 %
HCT: 38.2 % — ABNORMAL LOW (ref 39.0–52.0)
Hemoglobin: 14.2 g/dL (ref 13.0–17.0)
Immature Granulocytes: 1 %
Lymphocytes Relative: 23 %
Lymphs Abs: 1.1 10*3/uL (ref 0.7–4.0)
MCH: 34.2 pg — ABNORMAL HIGH (ref 26.0–34.0)
MCHC: 37.2 g/dL — ABNORMAL HIGH (ref 30.0–36.0)
MCV: 92 fL (ref 80.0–100.0)
Monocytes Absolute: 0.6 10*3/uL (ref 0.1–1.0)
Monocytes Relative: 14 %
Neutro Abs: 2.8 10*3/uL (ref 1.7–7.7)
Neutrophils Relative %: 59 %
Platelets: 193 10*3/uL (ref 150–400)
RBC: 4.15 MIL/uL — ABNORMAL LOW (ref 4.22–5.81)
RDW: 14.1 % (ref 11.5–15.5)
WBC: 4.7 10*3/uL (ref 4.0–10.5)
nRBC: 0 % (ref 0.0–0.2)

## 2022-05-05 LAB — CBG MONITORING, ED
Glucose-Capillary: 132 mg/dL — ABNORMAL HIGH (ref 70–99)
Glucose-Capillary: 63 mg/dL — ABNORMAL LOW (ref 70–99)
Glucose-Capillary: 155 mg/dL — ABNORMAL HIGH (ref 70–99)
Glucose-Capillary: 185 mg/dL — ABNORMAL HIGH (ref 70–99)

## 2022-05-05 LAB — LACTIC ACID, PLASMA: Lactic Acid, Venous: 1.3 mmol/L (ref 0.5–1.9)

## 2022-05-05 LAB — LIPASE, BLOOD: Lipase: 30 U/L (ref 11–51)

## 2022-05-05 LAB — ETHANOL: Alcohol, Ethyl (B): <10 mg/dL (ref <10)
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
## 2022-05-05 NOTE — Discharge Instructions (Addendum)
You have been evaluated for your symptoms.  Your low blood sugar may be due to substance use such as cocaine use.  Please monitor your blood sugar carefully, monitor your diet, avoid drug use as it is negatively impacting your health.  Return to the ER if your symptoms worsen or if you have other concern.

## 2022-05-05 NOTE — ED Notes (Signed)
Patient was given orange juice and graham crackers , girlfriend at bedside.

## 2022-05-05 NOTE — ED Notes (Signed)
Bear hugger applied 

## 2022-05-17 ENCOUNTER — Encounter: Payer: Self-pay | Admitting: Radiology

## 2022-06-11 ENCOUNTER — Emergency Department (HOSPITAL_COMMUNITY)
Admission: EM | Admit: 2022-06-11 | Discharge: 2022-06-11 | Disposition: A | Discharge status: Home / Self Care | Payer: Medicaid Other | Attending: Emergency Medicine | Admitting: Emergency Medicine

## 2022-06-11 ENCOUNTER — Other Ambulatory Visit: Payer: Self-pay

## 2022-06-11 ENCOUNTER — Emergency Department (HOSPITAL_COMMUNITY): Payer: Medicaid Other

## 2022-06-11 ENCOUNTER — Encounter (HOSPITAL_COMMUNITY): Payer: Self-pay

## 2022-06-11 DIAGNOSIS — T148XXA Other injury of unspecified body region, initial encounter: Secondary | ICD-10-CM

## 2022-06-11 DIAGNOSIS — Z23 Encounter for immunization: Secondary | ICD-10-CM | POA: Insufficient documentation

## 2022-06-11 DIAGNOSIS — M25512 Pain in left shoulder: Secondary | ICD-10-CM

## 2022-06-11 DIAGNOSIS — W268XXA Contact with other sharp object(s), not elsewhere classified, initial encounter: Secondary | ICD-10-CM | POA: Diagnosis not present

## 2022-06-11 DIAGNOSIS — S21131A Puncture wound without foreign body of right front wall of thorax without penetration into thoracic cavity, initial encounter: Secondary | ICD-10-CM | POA: Diagnosis not present

## 2022-06-11 DIAGNOSIS — S299XXA Unspecified injury of thorax, initial encounter: Secondary | ICD-10-CM | POA: Diagnosis present

## 2022-06-11 MED ORDER — CEPHALEXIN 500 MG PO CAPS: 500.0000 mg | ORAL_CAPSULE | Freq: Three times a day (TID) | ORAL | 0 refills | Status: AC

## 2022-06-11 MED ORDER — KETOROLAC TROMETHAMINE 15 MG/ML IJ SOLN
15.0000 mg | Freq: Once | INTRAMUSCULAR | Status: AC
  Administered 2022-06-11: 15 mg via INTRAMUSCULAR
  Filled 2022-06-11: qty 1

## 2022-06-11 MED ORDER — TETANUS-DIPHTH-ACELL PERTUSSIS 5-2.5-18.5 LF-MCG/0.5 IM SUSY
0.5000 mL | PREFILLED_SYRINGE | Freq: Once | INTRAMUSCULAR | Status: AC
  Administered 2022-06-11: 0.5 mL via INTRAMUSCULAR
  Filled 2022-06-11: qty 0.5

## 2022-06-11 NOTE — Discharge Instructions (Signed)
Your chest x-ray, shoulder x-ray did not show any concerning findings.  Your chest x-ray did show minimal amount of fluid in the bottom of the left lung.  This is not enough to do anything about right now.  This will need to be monitored.  I have given you a referral to primary care doctor if you do not already have one.  Please call to establish care.  Take Tylenol and ibuprofen as you need to for pain of the left shoulder.  Apply Neosporin to the puncture wound.  I have sent antibiotic into the pharmacy for you.  For any concerning symptoms return to the emergency department.  Your tetanus shot was updated today.
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
## 2022-06-21 ENCOUNTER — Other Ambulatory Visit (HOSPITAL_BASED_OUTPATIENT_CLINIC_OR_DEPARTMENT_OTHER): Payer: Self-pay | Admitting: Family Medicine

## 2022-06-21 DIAGNOSIS — J90 Pleural effusion, not elsewhere classified: Secondary | ICD-10-CM

## 2022-06-29 ENCOUNTER — Ambulatory Visit (HOSPITAL_BASED_OUTPATIENT_CLINIC_OR_DEPARTMENT_OTHER): Payer: Medicaid Other

## 2022-07-03 ENCOUNTER — Other Ambulatory Visit (HOSPITAL_BASED_OUTPATIENT_CLINIC_OR_DEPARTMENT_OTHER): Payer: Self-pay | Admitting: Family Medicine

## 2022-07-03 DIAGNOSIS — J90 Pleural effusion, not elsewhere classified: Secondary | ICD-10-CM
